# Patient Record
Sex: Female | Born: 1940
Health system: Southern US, Community
[De-identification: ages and names within clinical notes are randomized; demographics above are authoritative.]

## PROBLEM LIST (undated history)

## (undated) DIAGNOSIS — E039 Hypothyroidism, unspecified: Secondary | ICD-10-CM

## (undated) DIAGNOSIS — M1990 Unspecified osteoarthritis, unspecified site: Secondary | ICD-10-CM

## (undated) DIAGNOSIS — M7120 Synovial cyst of popliteal space [Baker], unspecified knee: Secondary | ICD-10-CM

## (undated) HISTORY — DX: Synovial cyst of popliteal space (Baker), unspecified knee: M71.20

## (undated) HISTORY — PX: ABDOMINAL HYSTERECTOMY: SHX81

## (undated) HISTORY — DX: Hypothyroidism, unspecified: E03.9

## (undated) HISTORY — DX: Unspecified osteoarthritis, unspecified site: M19.90

## (undated) HISTORY — PX: TONSILLECTOMY: SUR1361

---

## 2001-01-12 ENCOUNTER — Other Ambulatory Visit: Admission: RE | Admit: 2001-01-12 | Discharge: 2001-01-12 | Payer: Self-pay | Admitting: *Deleted

## 2001-01-12 ENCOUNTER — Ambulatory Visit (HOSPITAL_COMMUNITY): Admission: RE | Admit: 2001-01-12 | Discharge: 2001-01-12 | Payer: Self-pay | Admitting: *Deleted

## 2001-01-12 ENCOUNTER — Encounter: Payer: Self-pay | Admitting: *Deleted

## 2001-10-22 ENCOUNTER — Ambulatory Visit (HOSPITAL_COMMUNITY): Admission: RE | Admit: 2001-10-22 | Discharge: 2001-10-22 | Payer: Self-pay | Admitting: Family Medicine

## 2001-10-22 ENCOUNTER — Encounter: Payer: Self-pay | Admitting: Family Medicine

## 2002-10-29 ENCOUNTER — Encounter: Payer: Self-pay | Admitting: Family Medicine

## 2002-10-29 ENCOUNTER — Ambulatory Visit (HOSPITAL_COMMUNITY): Admission: RE | Admit: 2002-10-29 | Discharge: 2002-10-29 | Payer: Self-pay | Admitting: Family Medicine

## 2003-11-03 ENCOUNTER — Ambulatory Visit (HOSPITAL_COMMUNITY): Admission: RE | Admit: 2003-11-03 | Discharge: 2003-11-03 | Payer: Self-pay | Admitting: Family Medicine

## 2004-04-11 ENCOUNTER — Encounter: Admission: RE | Admit: 2004-04-11 | Discharge: 2004-04-11 | Payer: Self-pay | Admitting: Otolaryngology

## 2004-05-10 ENCOUNTER — Ambulatory Visit (HOSPITAL_COMMUNITY): Admission: RE | Admit: 2004-05-10 | Discharge: 2004-05-10 | Payer: Self-pay | Admitting: Family Medicine

## 2004-05-10 ENCOUNTER — Encounter: Payer: Self-pay | Admitting: Orthopedic Surgery

## 2004-09-05 ENCOUNTER — Ambulatory Visit: Payer: Self-pay | Admitting: Orthopedic Surgery

## 2004-12-24 ENCOUNTER — Ambulatory Visit (HOSPITAL_COMMUNITY): Admission: RE | Admit: 2004-12-24 | Discharge: 2004-12-24 | Payer: Self-pay | Admitting: Family Medicine

## 2005-12-26 ENCOUNTER — Ambulatory Visit (HOSPITAL_COMMUNITY): Admission: RE | Admit: 2005-12-26 | Discharge: 2005-12-26 | Payer: Self-pay | Admitting: Family Medicine

## 2006-06-03 ENCOUNTER — Ambulatory Visit: Payer: Self-pay | Admitting: Internal Medicine

## 2006-06-03 ENCOUNTER — Ambulatory Visit (HOSPITAL_COMMUNITY): Admission: RE | Admit: 2006-06-03 | Discharge: 2006-06-03 | Payer: Self-pay | Admitting: Internal Medicine

## 2006-12-29 ENCOUNTER — Ambulatory Visit (HOSPITAL_COMMUNITY): Admission: RE | Admit: 2006-12-29 | Discharge: 2006-12-29 | Payer: Self-pay | Admitting: Family Medicine

## 2008-01-28 ENCOUNTER — Ambulatory Visit (HOSPITAL_COMMUNITY): Admission: RE | Admit: 2008-01-28 | Discharge: 2008-01-28 | Payer: Self-pay | Admitting: Family Medicine

## 2009-02-08 ENCOUNTER — Ambulatory Visit (HOSPITAL_COMMUNITY): Admission: RE | Admit: 2009-02-08 | Discharge: 2009-02-08 | Payer: Self-pay | Admitting: Family Medicine

## 2009-07-12 ENCOUNTER — Ambulatory Visit (HOSPITAL_COMMUNITY): Admission: RE | Admit: 2009-07-12 | Discharge: 2009-07-12 | Payer: Self-pay | Admitting: Family Medicine

## 2010-02-14 ENCOUNTER — Encounter: Payer: Self-pay | Admitting: Orthopedic Surgery

## 2010-02-20 ENCOUNTER — Ambulatory Visit (HOSPITAL_COMMUNITY): Admission: RE | Admit: 2010-02-20 | Discharge: 2010-02-20 | Payer: Self-pay | Admitting: Family Medicine

## 2010-02-21 ENCOUNTER — Ambulatory Visit: Payer: Self-pay | Admitting: Orthopedic Surgery

## 2010-02-21 ENCOUNTER — Ambulatory Visit (HOSPITAL_COMMUNITY): Admission: RE | Admit: 2010-02-21 | Discharge: 2010-02-21 | Payer: Self-pay | Admitting: Family Medicine

## 2010-02-21 DIAGNOSIS — S52599A Other fractures of lower end of unspecified radius, initial encounter for closed fracture: Secondary | ICD-10-CM | POA: Insufficient documentation

## 2010-03-21 ENCOUNTER — Ambulatory Visit: Payer: Self-pay | Admitting: Orthopedic Surgery

## 2010-04-11 ENCOUNTER — Ambulatory Visit: Payer: Self-pay | Admitting: Orthopedic Surgery

## 2010-05-15 ENCOUNTER — Ambulatory Visit: Payer: Self-pay | Admitting: Orthopedic Surgery

## 2010-05-21 ENCOUNTER — Encounter (HOSPITAL_COMMUNITY)
Admission: RE | Admit: 2010-05-21 | Discharge: 2010-06-20 | Payer: Self-pay | Source: Home / Self Care | Attending: Orthopedic Surgery | Admitting: Orthopedic Surgery

## 2010-05-25 ENCOUNTER — Encounter: Payer: Self-pay | Admitting: Orthopedic Surgery

## 2010-06-11 ENCOUNTER — Encounter: Payer: Self-pay | Admitting: Orthopedic Surgery

## 2010-06-13 ENCOUNTER — Ambulatory Visit: Payer: Self-pay | Admitting: Orthopedic Surgery

## 2010-06-20 ENCOUNTER — Encounter: Payer: Self-pay | Admitting: Orthopedic Surgery

## 2010-07-24 NOTE — Assessment & Plan Note (Signed)
Summary: 3 WK RE-CK/XRAY WRIST/MEDICARE/CAF   Visit Type:  Follow-up Referring Provider:  Dr. Renard Matter  CC:  fx care rt wrist.  History of Present Illness: DOI 02/21/2010  DX: FRACTURE WRIST  TREATMENT SAC   Norco 5 for pain, no pain med needed.  Pain only with sleeping awkward.  Today is 3 week recheck xray OOP.  Doing good, swelling down, only some pain in thumb from cast.  no tenderness at the fracture site  AP lateral and oblique x-rays of the fractured RIGHT wrist  Findings slightly comminuted fracture with normal radial inclination and normal length and normal articular angle  Impression healed fracture  Recommend brace for a month and active range of motion exercises return in one month should not need x-rays just check range of motion  Allergies: 1)  ! Penicillin   Impression & Recommendations:  Problem # 1:  OTHER CLOSED FRACTURES OF DISTAL END OF RADIUS (ONG-295.28) Assessment Improved  Orders: Post-Op Check (41324) Wrist x-ray complete, minimum 3 views (40102)  Patient Instructions: 1)  BRACE X 1 month  2)  remove brace three x a day and bend the wrist 25 times  3)  return 1 month does not need xrays    Orders Added: 1)  Post-Op Check [99024] 2)  Wrist x-ray complete, minimum 3 views [73110]

## 2010-07-24 NOTE — Miscellaneous (Signed)
Summary: clinical Eval for OT  clinical Eval for OT   Imported By: Eugenio Hoes 05/30/2010 16:33:26  _____________________________________________________________________  External Attachment:    Type:   Image     Comment:   External Document

## 2010-07-24 NOTE — Assessment & Plan Note (Signed)
Summary: 1 M RE-CK RT WRIST/NO XRAYS THIS VISIT PER DR H/MEDICARE/CAF   Visit Type:  Follow-up Referring Provider:  Dr. Renard Matter  CC:  right wrist fracture.  History of Present Illness: I saw Carrie Osborne in the office today for a 1 month followup visit.  She is a 70 years old woman with the complaint of:  right wrist  DOI 02/21/2010  DX: FRACTURE WRIST  TREATMENT brace  Norco 5 for pain, no pain med needed.  She had been having pain and swelling in the thumb area. She has been doing her exercises and it is getting better.  What we see is that her range of motion has improved. She has a little tenosynovitis of the flexor tendon of the thumb. She cannot quite fully make a fist. She has no tenderness at the fracture site. The hand is slightly swollen. The metacarpophalangeal joints seem to be a little bit stiff with approximately 80 of flexion.  Recommend occupational therapy. Return in one month  Allergies: 1)  ! Penicillin   Impression & Recommendations:  Problem # 1:  OTHER CLOSED FRACTURES OF DISTAL END OF RADIUS (MWN-027.25) Assessment Improved  Orders: Occupational Therapy (OT) Post-Op Check (36644)  Medications Added to Medication List This Visit: 1)  Neurontin 100 Mg Caps (Gabapentin) .Marland Kitchen.. 1 by mouth three times a day may increase up to 300 mg three times a day as needed for right leg pain  Patient Instructions: 1)  OT 2 x week for 4 weeks 2)  Come back in a month Prescriptions: NEURONTIN 100 MG CAPS (GABAPENTIN) 1 by mouth three times a day may increase up to 300 mg three times a day as needed for right leg pain  #90 x 5   Entered and Authorized by:   Fuller Canada MD   Signed by:   Fuller Canada MD on 05/15/2010   Method used:   Print then Give to Patient   RxID:   [id number]    Orders Added: 1)  Occupational Therapy [OT] 2)  Post-Op Check [33295]

## 2010-07-24 NOTE — Progress Notes (Signed)
Summary: Initial Eval  Initial Eval   Imported By: Cammie Sickle 02/21/2010 16:20:10  _____________________________________________________________________  External Attachment:    Type:   Image     Comment:   External Document

## 2010-07-24 NOTE — Assessment & Plan Note (Signed)
Summary: EVAL/TREAT FX WRIST/XR [facility] 02/21/10/REF [facility]/MEDICARE,BC/CAF   Visit Type:  new patient Referring Novalie Leamy:  Dr. Renard Matter  CC:  right wrist fracture.Marland Kitchen  History of Present Illness: 70 year old female fell overstepping stone on August 30.  Went to the emergency room.  Comes in for followup complaining of constant severe sharp dull pain over the RIGHT wrist.  She notes some bruising and swelling.  Any motion of the wrist causes increased pain while keeping it still relieves it somewhat.  She's not taking anything for pain.  DOI 02-20-10.  Xrays taken at [facility] on 02-21-10 shows nondisplaced fracture of the distal radius with extension into the distal radial ulnar and radiocarpal joints. report and x-ray review debris with interpretation nondisplaced radius fracture distal   Allergies (verified): 1)  ! Penicillin  Past History:  Past Medical History: Low Thyroid High Cholesterol  Past Surgical History: T A H and B S O Cyst in breast  Family History: negative family history  Social History: social history married, retired, highest grade completed rate 12.  No smoking or alcohol.  Review of Systems Constitutional:  Denies weight loss, weight gain, fever, chills, and fatigue. Cardiovascular:  Denies chest pain, palpitations, fainting, and murmurs. Respiratory:  Denies short of breath, wheezing, couch, tightness, pain on inspiration, and snoring . Gastrointestinal:  Denies heartburn, nausea, vomiting, diarrhea, constipation, and blood in your stools. Genitourinary:  Denies frequency, urgency, difficulty urinating, painful urination, flank pain, and bleeding in urine. Neurologic:  Denies numbness, tingling, unsteady gait, dizziness, tremors, and seizure. Musculoskeletal:  Denies joint pain, swelling, instability, stiffness, redness, heat, and muscle pain. Endocrine:  Denies excessive thirst, exessive urination, and heat or cold intolerance. Psychiatric:  Denies  nervousness, depression, anxiety, and hallucinations. Skin:  Denies changes in the skin, poor healing, rash, itching, and redness. HEENT:  Denies blurred or double vision, eye pain, redness, and watering. Immunology:  Complains of seasonal allergies; denies sinus problems and allergic to bee stings. Hemoatologic:  Denies easy bleeding and brusing.  Physical Exam  Additional Exam:  GEN: well developed, well nourished, normal grooming and hygiene, no deformity and normal body habitus.   CDV: pulses are normal, no edema, no erythema. no tenderness  Lymph: normal lymph nodes   Skin: no rashes, skin lesions or open sores   NEURO: normal coordination, reflexes, sensation.   Psyche: awake, alert and oriented. Mood normal   Gait: Normal gait pattern  Evaluation of the RIGHT wrist reveals swelling and tenderness over the distal radius, range of motion limited to 20.  Motor exam muscle tone normal.  Joint stability not tested secondary to pain.  Elbow and shoulder have no tenderness normal range of motion normal strength and no instability.     Impression & Recommendations:  Problem # 1:  OTHER CLOSED FRACTURES OF DISTAL END OF RADIUS (ZOX-096.04) Assessment New a short arm cast applied   Orders: New Patient Level III (54098) Distal Radius Fx (25600)  Medications Added to Medication List This Visit: 1)  Norco 5-325 Mg Tabs (Hydrocodone-acetaminophen) .Marland Kitchen.. 1 by mouth q 6 as needed pain  Patient Instructions: 1)  4 weeks  x-rays OOP Prescriptions: NORCO 5-325 MG TABS (HYDROCODONE-ACETAMINOPHEN) 1 by mouth q 6 as needed pain  #30 x 1   Entered and Authorized by:   Fuller Canada MD   Signed by:   Fuller Canada MD on 02/21/2010   Method used:   Print then Give to Patient   RxID:   [id number]

## 2010-07-24 NOTE — Assessment & Plan Note (Signed)
Summary: 4 WK RE-CK/XRAY WRIST/FX CARE/MEDICARE,CAF   Visit Type:  Follow-up Referring Provider:  Dr. Renard Matter  CC:  fx care wrist.  History of Present Illness:  Today is 4 week recheck, xrays OOP rt wrist.  Norco 5 for pain, no pain med needed.  Pain only with sleeping awkward.  we did take new x-rays today, and it shows fractures and stable alignment, but has not healed.  repeat casting for 3 weeks.      Allergies: 1)  ! Penicillin   Impression & Recommendations:  Problem # 1:  OTHER CLOSED FRACTURES OF DISTAL END OF RADIUS (ICD-813.42)  3 views, RIGHT wrist. There is a nondisplaced distal radius fracture, which has not completely healed, but has not lost position.  Impression healing RIGHT distal radius fracture. Plan short arm cast. Additional 3 weeks and x-ray out of plaster  Orders: Post-Op Check (95621) Wrist x-ray complete, minimum 3 views (30865)  Patient Instructions: 1)  XRAYS OOP IN 3 WEEKS

## 2010-07-24 NOTE — Letter (Signed)
Summary: History form  History form   Imported By: Jacklynn Ganong 03/02/2010 09:37:44  _____________________________________________________________________  External Attachment:    Type:   Image     Comment:   External Document

## 2010-07-26 NOTE — Miscellaneous (Signed)
Summary: OT PROGRESS NOTES   OT PROGRESS NOTES   Imported By: Jacklynn Ganong 06/12/2010 16:00:06  _____________________________________________________________________  External Attachment:    Type:   Image     Comment:   External Document

## 2010-07-26 NOTE — Miscellaneous (Signed)
Summary: Progress note OT   Progress note OT   Imported By: Eugenio Hoes 06/28/2010 14:41:37  _____________________________________________________________________  External Attachment:    Type:   Image     Comment:   External Document

## 2010-07-26 NOTE — Assessment & Plan Note (Signed)
Summary: 1 M RE-CK RT WRIST FOL'GPT/OT/MEDICARE/BCBS/CAF   Visit Type:  Follow-up Referring Provider:  Dr. Renard Matter  CC:  a fracture and RIGHT wrist.  History of Present Illness:   A 70 year old female had a RIGHT wrist fracture treated with a cast followed by occupational therapy.  She is doing well with improved range of motion and strength and functional abilities.  She is full pronation, supination, near full flexion-extension of the wrist.  Doing well. Follow up as needed after finishing physical therapy, occupational therapy  Allergies: 1)  ! Penicillin   Impression & Recommendations:  Problem # 1:  OTHER CLOSED FRACTURES OF DISTAL END OF RADIUS (ZOX-096.04) Assessment Improved  Orders: Est. Patient Level II (54098)  Patient Instructions: 1)  Finish PT /OT  2)  return as needed    Orders Added: 1)  Est. Patient Level II [11914]

## 2010-11-09 NOTE — Op Note (Signed)
[name], Carrie Osborne             ACCOUNT NO.:  [id number]   MEDICAL RECORD NO.:  [id number]          PATIENT TYPE:  AMB   LOCATION:  DAY                           FACILITY:  [facility]   PHYSICIAN:  R. Roetta Sessions, M.D. DATE OF BIRTH:  22-May-1941   DATE OF PROCEDURE:  06/03/2006  DATE OF DISCHARGE:                               OPERATIVE REPORT   PROCEDURE PERFORMED:  Screening colonoscopy.   INDICATIONS FOR PROCEDURE:  The patient is a 70 year old Caucasian  female devoid of any lower GI tract symptoms sent over at the courtesy  of Dr. Butch Penny for colorectal cancer screening.  There is no  family history of colorectal neoplasia in any first degree relatives.  She has no symptoms, as stated above, and she has never had her lower GI  tract imaged.  Colonoscopy is now being done as a screening maneuver.  This approach has been discussed with the patient at length.  The  potential risks, benefits, and alternatives have been reviewed,  questions answered, she is agreeable.  Please see documentation in the  medical record.   DESCRIPTION OF PROCEDURE:  O2 saturation, blood pressure, and pulse rate  were monitored throughout the entire procedure.  Conscious sedation with  Versed 5 mg IV and Demerol 75 mg IV in divided doses.   INSTRUMENT USED:  Pentax video chip system.   FINDINGS:  Digital rectal examination revealed no abnormalities.  Endoscopic findings:  The prep was good.  Colon:  The colonic mucosa was surveyed from the rectosigmoid junction  through the left, transverse, right colon, to the area of the  appendiceal orifice and ileocecal valve and cecum.  These structures  were well seen and photographed for the record.  From this level, the  scope was slowly and cautiously withdrawn.  All previously mentioned  mucosal surfaces were again seen.  The patient had scattered sigmoid  diverticulum.  The remainder of the colonic mucosa appeared normal.  The  scope was pulled down  to the rectum.  A thorough examination of the  rectal mucosa including a retroflex view of the anal verge was  undertaken.  The rectal mucosa appeared normal.  The patient tolerated  the procedure well and was reacted in endoscopy.   IMPRESSION:  1. Normal rectum.  2. Left sided diverticulum.  3. The remainder of the colonic mucosa appeared normal.   RECOMMENDATIONS:  1. Diverticulosis literature provided to [name].  2. Repeat screening colonoscopy in ten years.      Jonathon Bellows, M.D.  Electronically Signed     RMR/MEDQ  D:  06/03/2006  T:  06/03/2006  Job:  [id number]   cc:   Angus G. Renard Matter, MD  Fax: [phone]

## 2011-01-31 ENCOUNTER — Other Ambulatory Visit (HOSPITAL_COMMUNITY): Payer: Self-pay | Admitting: Family Medicine

## 2011-01-31 DIAGNOSIS — Z139 Encounter for screening, unspecified: Secondary | ICD-10-CM

## 2011-02-26 ENCOUNTER — Ambulatory Visit (HOSPITAL_COMMUNITY)
Admission: RE | Admit: 2011-02-26 | Discharge: 2011-02-26 | Disposition: A | Discharge status: Home / Self Care | Payer: Medicare Other | Source: Ambulatory Visit | Attending: Family Medicine | Admitting: Family Medicine

## 2011-02-26 DIAGNOSIS — Z139 Encounter for screening, unspecified: Secondary | ICD-10-CM

## 2011-02-26 DIAGNOSIS — Z1231 Encounter for screening mammogram for malignant neoplasm of breast: Secondary | ICD-10-CM | POA: Insufficient documentation

## 2012-03-12 ENCOUNTER — Other Ambulatory Visit (HOSPITAL_COMMUNITY): Payer: Self-pay | Admitting: Family Medicine

## 2012-03-12 DIAGNOSIS — Z139 Encounter for screening, unspecified: Secondary | ICD-10-CM

## 2012-03-19 ENCOUNTER — Ambulatory Visit (HOSPITAL_COMMUNITY)
Admission: RE | Admit: 2012-03-19 | Discharge: 2012-03-19 | Disposition: A | Discharge status: Home / Self Care | Payer: Medicare Other | Source: Ambulatory Visit | Attending: Family Medicine | Admitting: Family Medicine

## 2012-03-19 DIAGNOSIS — Z1231 Encounter for screening mammogram for malignant neoplasm of breast: Secondary | ICD-10-CM | POA: Insufficient documentation

## 2012-03-19 DIAGNOSIS — Z139 Encounter for screening, unspecified: Secondary | ICD-10-CM

## 2012-04-23 ENCOUNTER — Other Ambulatory Visit: Payer: Self-pay | Admitting: Family Medicine

## 2012-04-23 DIAGNOSIS — M545 Low back pain: Secondary | ICD-10-CM

## 2012-04-27 ENCOUNTER — Ambulatory Visit (HOSPITAL_COMMUNITY)
Admission: RE | Admit: 2012-04-27 | Discharge: 2012-04-27 | Disposition: A | Discharge status: Home / Self Care | Payer: Medicare Other | Source: Ambulatory Visit | Attending: Family Medicine | Admitting: Family Medicine

## 2012-04-27 DIAGNOSIS — M545 Low back pain, unspecified: Secondary | ICD-10-CM | POA: Insufficient documentation

## 2012-04-27 DIAGNOSIS — M79609 Pain in unspecified limb: Secondary | ICD-10-CM | POA: Insufficient documentation

## 2012-04-27 DIAGNOSIS — M5126 Other intervertebral disc displacement, lumbar region: Secondary | ICD-10-CM | POA: Insufficient documentation

## 2012-04-29 ENCOUNTER — Other Ambulatory Visit: Payer: Self-pay | Admitting: Family Medicine

## 2012-04-29 DIAGNOSIS — M545 Low back pain: Secondary | ICD-10-CM

## 2012-05-05 ENCOUNTER — Other Ambulatory Visit: Payer: Medicare Other

## 2012-05-05 ENCOUNTER — Ambulatory Visit
Admission: RE | Admit: 2012-05-05 | Discharge: 2012-05-05 | Disposition: A | Discharge status: Home / Self Care | Payer: Medicare Other | Source: Ambulatory Visit | Attending: Family Medicine | Admitting: Family Medicine

## 2012-05-05 DIAGNOSIS — M545 Low back pain: Secondary | ICD-10-CM

## 2012-05-05 MED ORDER — IOHEXOL 180 MG/ML  SOLN
1.0000 mL | Freq: Once | INTRAMUSCULAR | Status: AC | PRN
  Administered 2012-05-05: 1 mL via INTRAVENOUS

## 2012-05-05 MED ORDER — METHYLPREDNISOLONE ACETATE 40 MG/ML INJ SUSP (RADIOLOG
120.0000 mg | Freq: Once | INTRAMUSCULAR | Status: AC
  Administered 2012-05-05: 120 mg via EPIDURAL

## 2012-05-13 ENCOUNTER — Other Ambulatory Visit (HOSPITAL_COMMUNITY): Payer: Self-pay | Admitting: Family Medicine

## 2012-05-13 DIAGNOSIS — M545 Low back pain: Secondary | ICD-10-CM

## 2012-05-18 ENCOUNTER — Other Ambulatory Visit: Payer: Self-pay | Admitting: Family Medicine

## 2012-05-18 DIAGNOSIS — M5030 Other cervical disc degeneration, unspecified cervical region: Secondary | ICD-10-CM

## 2012-05-26 ENCOUNTER — Other Ambulatory Visit: Payer: Self-pay | Admitting: Family Medicine

## 2012-05-26 ENCOUNTER — Ambulatory Visit
Admission: RE | Admit: 2012-05-26 | Discharge: 2012-05-26 | Disposition: A | Discharge status: Home / Self Care | Payer: Medicare Other | Source: Ambulatory Visit | Attending: Family Medicine | Admitting: Family Medicine

## 2012-05-26 DIAGNOSIS — M5030 Other cervical disc degeneration, unspecified cervical region: Secondary | ICD-10-CM

## 2012-05-26 MED ORDER — METHYLPREDNISOLONE ACETATE 40 MG/ML INJ SUSP (RADIOLOG
120.0000 mg | Freq: Once | INTRAMUSCULAR | Status: AC
  Administered 2012-05-26: 120 mg via EPIDURAL

## 2012-05-26 MED ORDER — IOHEXOL 180 MG/ML  SOLN
1.0000 mL | Freq: Once | INTRAMUSCULAR | Status: AC | PRN
  Administered 2012-05-26: 1 mL via EPIDURAL

## 2012-06-03 ENCOUNTER — Other Ambulatory Visit: Payer: Self-pay | Admitting: Family Medicine

## 2012-06-03 DIAGNOSIS — M549 Dorsalgia, unspecified: Secondary | ICD-10-CM

## 2012-06-11 ENCOUNTER — Other Ambulatory Visit: Payer: Medicare Other

## 2012-09-28 ENCOUNTER — Other Ambulatory Visit (HOSPITAL_COMMUNITY): Payer: Self-pay | Admitting: Family Medicine

## 2012-09-28 DIAGNOSIS — R52 Pain, unspecified: Secondary | ICD-10-CM

## 2012-09-29 ENCOUNTER — Ambulatory Visit (HOSPITAL_COMMUNITY)
Admission: RE | Admit: 2012-09-29 | Discharge: 2012-09-29 | Disposition: A | Discharge status: Home / Self Care | Payer: Medicare Other | Source: Ambulatory Visit | Attending: Family Medicine | Admitting: Family Medicine

## 2012-09-29 ENCOUNTER — Other Ambulatory Visit (HOSPITAL_COMMUNITY): Payer: Self-pay | Admitting: Family Medicine

## 2012-09-29 DIAGNOSIS — R52 Pain, unspecified: Secondary | ICD-10-CM

## 2012-09-29 DIAGNOSIS — M7989 Other specified soft tissue disorders: Secondary | ICD-10-CM | POA: Insufficient documentation

## 2012-09-29 DIAGNOSIS — M79609 Pain in unspecified limb: Secondary | ICD-10-CM | POA: Insufficient documentation

## 2012-10-20 ENCOUNTER — Encounter: Payer: Self-pay | Admitting: Orthopedic Surgery

## 2012-10-20 ENCOUNTER — Ambulatory Visit (INDEPENDENT_AMBULATORY_CARE_PROVIDER_SITE_OTHER): Payer: Medicare Other | Admitting: Orthopedic Surgery

## 2012-10-20 VITALS — BP 136/70 | Ht 61.0 in | Wt 183.0 lb

## 2012-10-20 DIAGNOSIS — M1712 Unilateral primary osteoarthritis, left knee: Secondary | ICD-10-CM | POA: Insufficient documentation

## 2012-10-20 DIAGNOSIS — M1710 Unilateral primary osteoarthritis, unspecified knee: Secondary | ICD-10-CM

## 2012-10-20 DIAGNOSIS — M23329 Other meniscus derangements, posterior horn of medial meniscus, unspecified knee: Secondary | ICD-10-CM

## 2012-10-20 DIAGNOSIS — M23322 Other meniscus derangements, posterior horn of medial meniscus, left knee: Secondary | ICD-10-CM

## 2012-10-20 DIAGNOSIS — M7120 Synovial cyst of popliteal space [Baker], unspecified knee: Secondary | ICD-10-CM | POA: Insufficient documentation

## 2012-10-20 DIAGNOSIS — M7122 Synovial cyst of popliteal space [Baker], left knee: Secondary | ICD-10-CM

## 2012-10-20 NOTE — Patient Instructions (Addendum)
Knee Injection  Joint injections are shots. Your caregiver will place a needle into your knee joint. The needle is used to put medicine into the joint. These shots can be used to help treat different painful knee conditions such as osteoarthritis, bursitis, local flare-ups of rheumatoid arthritis, and pseudogout. Anti-inflammatory medicines such as corticosteroids and anesthetics are the most common medicines used for joint and soft tissue injections.   PROCEDURE  · The skin over the kneecap will be cleaned with an antiseptic solution.  · Your caregiver will inject a small amount of a local anesthetic (a medicine like Novocaine) just under the skin in the area that was cleaned.  · After the area becomes numb, a second injection is done. This second injection usually includes an anesthetic and an anti-inflammatory medicine called a steroid or cortisone. The needle is carefully placed in between the kneecap and the knee, and the medicine is injected into the joint space.  · After the injection is done, the needle is removed. Your caregiver may place a bandage over the injection site. The whole procedure takes no more than a couple of minutes.  BEFORE THE PROCEDURE   Wash all of the skin around the entire knee area. Try to remove any loose, scaling skin. There is no other specific preparation necessary unless advised otherwise by your caregiver.  LET YOUR CAREGIVER KNOW ABOUT:   · Allergies.  · Medications taken including herbs, eye drops, over the counter medications, and creams.  · Use of steroids (by mouth or creams).  · Possible pregnancy, if applicable.  · Previous problems with anesthetics or Novocaine.  · History of blood clots (thrombophlebitis).  · History of bleeding or blood problems.  · Previous surgery.  · Other health problems.  RISKS AND COMPLICATIONS  Side effects from cortisone shots are rare. They include:   · Slight bruising of the skin.  · Shrinkage of the normal fatty tissue under the skin where  the shot was given.  · Increase in pain after the shot.  · Infection.  · Weakening of tendons or tendon rupture.  · Allergic reaction to the medicine.  · Diabetics may have a temporary increase in their blood sugar after a shot.  · Cortisone can temporarily weaken the immune system. While receiving these shots, you should not get certain vaccines. Also, avoid contact with anyone who has chickenpox or measles. Especially if you have never had these diseases or have not been previously immunized. Your immune system may not be strong enough to fight off the infection while the cortisone is in your system.  AFTER THE PROCEDURE   · You can go home after the procedure.  · You may need to put ice on the joint 15 to 20 minutes every 3 or 4 hours until the pain goes away.  · You may need to put an elastic bandage on the joint.  HOME CARE INSTRUCTIONS   · Only take over-the-counter or prescription medicines for pain, discomfort, or fever as directed by your caregiver.  · You should avoid stressing the joint. Unless advised otherwise, avoid activities that put a lot of pressure on a knee joint, such as:  · Jogging.  · Bicycling.  · Recreational climbing.  · Hiking.  · Laying down and elevating the leg/knee above the level of your heart can help to minimize swelling.  SEEK MEDICAL CARE IF:   · You have repeated or worsening swelling.  · There is drainage from the puncture area.  ·   You develop red streaking that extends above or below the site where the needle was inserted.  SEEK IMMEDIATE MEDICAL CARE IF:   · You develop a fever.  · You have pain that gets worse even though you are taking pain medicine.  · The area is red and warm, and you have trouble moving the joint.  MAKE SURE YOU:   · Understand these instructions.  · Will watch your condition.  · Will get help right away if you are not doing well or get worse.  Document Released: 09/01/2006 Document Revised: 09/02/2011 Document Reviewed: 05/29/2007  [employer]® Patient  Information ©2013 [employer].

## 2012-10-20 NOTE — Progress Notes (Signed)
Patient ID: Carrie Osborne, female   DOB: May 24, 1941, 72 y.o.   MRN: [id number] Chief Complaint  Patient presents with  . Knee Pain    Bakers cyst in left knee    HISTORY:  72 year old female with previous history Baker's cyst presents with left knee Baker cyst starting with pain and swelling April 3. She went to see a local orthopedist when I was out of town he gave her an injection to work for about 2 weeks she complains of throbbing burning 9/10 constant pain mostly mid afternoon and early evening, it is relieved by ice pack and rest, is worse when she tries to enter knee she has noticed some joint, she has not had any locking or catching. Review of systems seasonal allergies and joint swelling of the knee no chest pain or shortness of breath no numbness or tingling  She reports penicillin allergy  Medical problems of high cholesterol and low thyroid  Previous surgery hysterectomy  She is married retired does not smoke or drink  Medications Synthroid, loratadine, calcium, niacin, simvastatin, coenzyme Q.  BP 136/70  Ht 5\' 1"  (1.549 m)  Wt 183 lb (83.008 kg)  BMI 34.6 kg/m2 General appearance is normal, the patient is alert and oriented x3 with normal mood and affect. She does not appear to limp or have an abnormal gait. She is tender over the medial joint line there is no joint effusion the bursa is nontender she is tender slightly over the lateral joint line, knee is stable, muscle tone normal, skin intact, pulses good mild edema distally normal sensation  I think she either has a torn medial meniscus degenerative, degenerative arthritis, remote possibility of loose body or a combination of.  Recommend injection followup 3 weeks if no improvement consider MRI  Knee  Injection Procedure Note  Pre-operative Diagnosis: left knee oa  Post-operative Diagnosis: same  Indications: pain  Anesthesia: ethyl chloride   Procedure Details   Verbal consent was obtained for the  procedure. Time out was completed.The joint was prepped with alcohol, followed by  Ethyl chloride spray and A 20 gauge needle was inserted into the knee via lateral approach; 4ml 1% lidocaine and 1 ml of depomedrol  was then injected into the joint . The needle was removed and the area cleansed and dressed.  Complications:  None; patient tolerated the procedure well.  Baker's cyst of knee, left  Osteoarthritis of left knee  Medial meniscus, posterior horn derangement, left

## 2012-10-26 ENCOUNTER — Ambulatory Visit: Payer: Medicare Other | Admitting: Orthopedic Surgery

## 2012-10-27 ENCOUNTER — Telehealth: Payer: Self-pay | Admitting: Orthopedic Surgery

## 2012-10-27 NOTE — Telephone Encounter (Signed)
Carrie Osborne said her pain is worse, going down her calf.  She is not scheduled to see you until 11/10/12, and is asking for an earlier appointment.  She is worried that the cyst needs to be drained.  Said the pain is so bad it is waking her up at night.  Please advise.  Her #  h - [id number] or c [phone]

## 2012-10-28 ENCOUNTER — Other Ambulatory Visit: Payer: Self-pay | Admitting: Orthopedic Surgery

## 2012-10-28 MED ORDER — PREDNISONE 10 MG PO KIT: 10.0000 mg | PACK | ORAL | Status: DC

## 2012-10-28 NOTE — Telephone Encounter (Signed)
Tammy spoke with the patient

## 2012-10-28 NOTE — Telephone Encounter (Signed)
#  1 we would not drain the cyst   #2 more likely to be related to her back problem   #3 order steroid dose pack (done)   # 4 order repeat lumbar esi series (Tammy place this order)  (Tammy and I will put orders in)

## 2012-10-28 NOTE — Telephone Encounter (Signed)
Relayed information to patient

## 2012-10-28 NOTE — Telephone Encounter (Signed)
Carrie Osborne called back this morning, asking to be seen today,said her knee is swollen very badly and the pain is gong up to her buttocks. Told her you are only here a short while this morning and then have to be at the hospital,

## 2012-10-29 ENCOUNTER — Ambulatory Visit (INDEPENDENT_AMBULATORY_CARE_PROVIDER_SITE_OTHER): Payer: Medicare Other | Admitting: Orthopedic Surgery

## 2012-10-29 ENCOUNTER — Encounter: Payer: Self-pay | Admitting: Orthopedic Surgery

## 2012-10-29 VITALS — BP 130/82 | Ht 61.0 in | Wt 183.0 lb

## 2012-10-29 DIAGNOSIS — M7630 Iliotibial band syndrome, unspecified leg: Secondary | ICD-10-CM

## 2012-10-29 DIAGNOSIS — M7120 Synovial cyst of popliteal space [Baker], unspecified knee: Secondary | ICD-10-CM

## 2012-10-29 DIAGNOSIS — M6580 Other synovitis and tenosynovitis, unspecified site: Secondary | ICD-10-CM

## 2012-10-29 NOTE — Progress Notes (Signed)
Patient ID: Carrie Osborne, female   DOB: 27-Mar-1941, 72 y.o.   MRN: [id number] Chief Complaint  Patient presents with  . Follow-up    3 week recheck left knee bakers cyst    History the patient was scheduled for 3 week followup related to a Baker's cyst and leg pain which is primarily behind the knee associated with some lateral pain and burning on the side of the knee  She comes in earlier because of severe pain. We asked her to take a Dosepak she wants to talk over with me and be reexamined first  The knee does not catch lock or give way  Most of the pain is behind the knee and just some burning on the side of the knee  BP 130/82  Ht 5\' 1"  (1.549 m)  Wt 183 lb (83.008 kg)  BMI 34.6 kg/m2  General appearance is normal, the patient is alert and oriented x3 with normal mood and affect.  She walks fine she has normal flexion extension of the knee with no pain she has some mild patellofemoral crepitance the medial and lateral joint lines are nontender she has tenderness in the popliteal fossa and over the iliotibial band skin is normal no joint effusion distal pulses are intact and sensation is normal distally  I injected her iliotibial band and asked her to go ahead and take the Dosepak he has a history of lumbar disease status post 2 epidural steroid injections which improved her back pain  Differential diagnosis peripheral neuropathy Peripheral radiculopathy Meniscal tear Tendinitis  Procedure Inject iliotibial band Verbal consent Timeout confirmed left knee as procedure site Under sterile conditions the lateral iliotibial band distal insertion was injected with 40 mg of Depo-Medrol and 1 cc 1% lidocaine this was tolerated well without complications

## 2012-10-29 NOTE — Patient Instructions (Addendum)
Pick up dose pack at pharmacy

## 2012-11-10 ENCOUNTER — Ambulatory Visit: Payer: Medicare Other | Admitting: Orthopedic Surgery

## 2012-11-12 ENCOUNTER — Ambulatory Visit (INDEPENDENT_AMBULATORY_CARE_PROVIDER_SITE_OTHER): Payer: Medicare Other | Admitting: Orthopedic Surgery

## 2012-11-12 ENCOUNTER — Encounter: Payer: Self-pay | Admitting: Orthopedic Surgery

## 2012-11-12 VITALS — BP 124/76 | Ht 61.0 in | Wt 183.0 lb

## 2012-11-12 DIAGNOSIS — M7630 Iliotibial band syndrome, unspecified leg: Secondary | ICD-10-CM

## 2012-11-12 DIAGNOSIS — IMO0002 Reserved for concepts with insufficient information to code with codable children: Secondary | ICD-10-CM

## 2012-11-12 DIAGNOSIS — M6580 Other synovitis and tenosynovitis, unspecified site: Secondary | ICD-10-CM

## 2012-11-12 DIAGNOSIS — M792 Neuralgia and neuritis, unspecified: Secondary | ICD-10-CM

## 2012-11-12 MED ORDER — PREDNISONE 10 MG PO KIT: 10.0000 mg | PACK | ORAL | Status: DC

## 2012-11-12 NOTE — Progress Notes (Signed)
Patient ID: Carrie Osborne, female   DOB: Mar 21, 1941, 72 y.o.   MRN: [id number] Chief Complaint  Patient presents with  . Follow-up    2 week recheck left knee after dose pack    BP 124/76  Ht 5\' 1"  (1.549 m)  Wt 183 lb (83.008 kg)  BMI 34.6 kg/m2  The patient took the dose back in noted relief. She still has some residual numbness on the lateral knee no giving way symptoms  Review of system denies back vitals signs as recorded amylase is normal and he is stable full range of motion she has some tenderness over the iliotibial band distally. Motor exam is normal skin is intact decreased sensation along the lateral knee. She joint x3 mood and affect are normal appearance is normal  Impression neuralgia of some etiology. Recommend continue dose pack follow up as needed

## 2012-11-12 NOTE — Patient Instructions (Addendum)
Resume dose pack

## 2012-12-21 ENCOUNTER — Ambulatory Visit (HOSPITAL_COMMUNITY)
Admission: RE | Admit: 2012-12-21 | Discharge: 2012-12-21 | Disposition: A | Discharge status: Home / Self Care | Payer: Medicare Other | Source: Ambulatory Visit | Attending: Family Medicine | Admitting: Family Medicine

## 2012-12-21 ENCOUNTER — Other Ambulatory Visit (HOSPITAL_COMMUNITY): Payer: Self-pay | Admitting: Family Medicine

## 2012-12-21 DIAGNOSIS — R52 Pain, unspecified: Secondary | ICD-10-CM

## 2012-12-21 DIAGNOSIS — R1032 Left lower quadrant pain: Secondary | ICD-10-CM

## 2012-12-21 DIAGNOSIS — K5730 Diverticulosis of large intestine without perforation or abscess without bleeding: Secondary | ICD-10-CM | POA: Insufficient documentation

## 2012-12-21 DIAGNOSIS — K5732 Diverticulitis of large intestine without perforation or abscess without bleeding: Secondary | ICD-10-CM | POA: Insufficient documentation

## 2012-12-21 MED ORDER — IOHEXOL 300 MG/ML  SOLN
100.0000 mL | Freq: Once | INTRAMUSCULAR | Status: AC | PRN
  Administered 2012-12-21: 100 mL via INTRAVENOUS

## 2013-03-15 ENCOUNTER — Other Ambulatory Visit (HOSPITAL_COMMUNITY): Payer: Self-pay | Admitting: Family Medicine

## 2013-03-15 DIAGNOSIS — Z139 Encounter for screening, unspecified: Secondary | ICD-10-CM

## 2013-03-23 ENCOUNTER — Ambulatory Visit (HOSPITAL_COMMUNITY)
Admission: RE | Admit: 2013-03-23 | Discharge: 2013-03-23 | Disposition: A | Discharge status: Home / Self Care | Payer: Medicare Other | Source: Ambulatory Visit | Attending: Family Medicine | Admitting: Family Medicine

## 2013-03-23 DIAGNOSIS — Z1231 Encounter for screening mammogram for malignant neoplasm of breast: Secondary | ICD-10-CM | POA: Insufficient documentation

## 2013-03-23 DIAGNOSIS — Z139 Encounter for screening, unspecified: Secondary | ICD-10-CM

## 2013-07-12 DIAGNOSIS — L570 Actinic keratosis: Secondary | ICD-10-CM | POA: Diagnosis not present

## 2013-07-12 DIAGNOSIS — C44319 Basal cell carcinoma of skin of other parts of face: Secondary | ICD-10-CM | POA: Diagnosis not present

## 2013-09-23 DIAGNOSIS — Z0000 Encounter for general adult medical examination without abnormal findings: Secondary | ICD-10-CM | POA: Diagnosis not present

## 2013-09-23 DIAGNOSIS — E785 Hyperlipidemia, unspecified: Secondary | ICD-10-CM | POA: Diagnosis not present

## 2013-09-23 DIAGNOSIS — Z79899 Other long term (current) drug therapy: Secondary | ICD-10-CM | POA: Diagnosis not present

## 2013-09-23 DIAGNOSIS — E039 Hypothyroidism, unspecified: Secondary | ICD-10-CM | POA: Diagnosis not present

## 2013-10-05 DIAGNOSIS — H7200 Central perforation of tympanic membrane, unspecified ear: Secondary | ICD-10-CM | POA: Diagnosis not present

## 2013-10-05 DIAGNOSIS — H6980 Other specified disorders of Eustachian tube, unspecified ear: Secondary | ICD-10-CM | POA: Diagnosis not present

## 2013-10-05 DIAGNOSIS — J301 Allergic rhinitis due to pollen: Secondary | ICD-10-CM | POA: Diagnosis not present

## 2013-10-05 DIAGNOSIS — H6120 Impacted cerumen, unspecified ear: Secondary | ICD-10-CM | POA: Diagnosis not present

## 2013-11-11 DIAGNOSIS — L910 Hypertrophic scar: Secondary | ICD-10-CM | POA: Diagnosis not present

## 2013-11-11 DIAGNOSIS — D485 Neoplasm of uncertain behavior of skin: Secondary | ICD-10-CM | POA: Diagnosis not present

## 2013-11-11 DIAGNOSIS — D235 Other benign neoplasm of skin of trunk: Secondary | ICD-10-CM | POA: Diagnosis not present

## 2014-01-06 DIAGNOSIS — L905 Scar conditions and fibrosis of skin: Secondary | ICD-10-CM | POA: Diagnosis not present

## 2014-02-07 DIAGNOSIS — L905 Scar conditions and fibrosis of skin: Secondary | ICD-10-CM | POA: Diagnosis not present

## 2014-02-07 DIAGNOSIS — L570 Actinic keratosis: Secondary | ICD-10-CM | POA: Diagnosis not present

## 2014-03-21 ENCOUNTER — Other Ambulatory Visit (HOSPITAL_COMMUNITY): Payer: Self-pay | Admitting: Family Medicine

## 2014-03-21 DIAGNOSIS — Z139 Encounter for screening, unspecified: Secondary | ICD-10-CM

## 2014-03-21 DIAGNOSIS — Z1231 Encounter for screening mammogram for malignant neoplasm of breast: Secondary | ICD-10-CM

## 2014-03-24 ENCOUNTER — Ambulatory Visit (HOSPITAL_COMMUNITY)
Admission: RE | Admit: 2014-03-24 | Discharge: 2014-03-24 | Disposition: A | Discharge status: Home / Self Care | Payer: Medicare Other | Source: Ambulatory Visit | Attending: Family Medicine | Admitting: Family Medicine

## 2014-03-24 DIAGNOSIS — Z1231 Encounter for screening mammogram for malignant neoplasm of breast: Secondary | ICD-10-CM | POA: Insufficient documentation

## 2014-03-30 DIAGNOSIS — Z23 Encounter for immunization: Secondary | ICD-10-CM | POA: Diagnosis not present

## 2014-04-06 DIAGNOSIS — E039 Hypothyroidism, unspecified: Secondary | ICD-10-CM | POA: Diagnosis not present

## 2014-04-06 DIAGNOSIS — E785 Hyperlipidemia, unspecified: Secondary | ICD-10-CM | POA: Diagnosis not present

## 2014-04-06 DIAGNOSIS — Z23 Encounter for immunization: Secondary | ICD-10-CM | POA: Diagnosis not present

## 2014-06-29 DIAGNOSIS — H35042 Retinal micro-aneurysms, unspecified, left eye: Secondary | ICD-10-CM | POA: Diagnosis not present

## 2014-06-29 DIAGNOSIS — H34832 Tributary (branch) retinal vein occlusion, left eye: Secondary | ICD-10-CM | POA: Diagnosis not present

## 2014-06-29 DIAGNOSIS — H35352 Cystoid macular degeneration, left eye: Secondary | ICD-10-CM | POA: Diagnosis not present

## 2014-07-26 DIAGNOSIS — H7202 Central perforation of tympanic membrane, left ear: Secondary | ICD-10-CM | POA: Diagnosis not present

## 2014-08-09 DIAGNOSIS — H35352 Cystoid macular degeneration, left eye: Secondary | ICD-10-CM | POA: Diagnosis not present

## 2014-08-09 DIAGNOSIS — H34832 Tributary (branch) retinal vein occlusion, left eye: Secondary | ICD-10-CM | POA: Diagnosis not present

## 2014-09-26 DIAGNOSIS — H34832 Tributary (branch) retinal vein occlusion, left eye: Secondary | ICD-10-CM | POA: Diagnosis not present

## 2014-09-29 ENCOUNTER — Other Ambulatory Visit (HOSPITAL_COMMUNITY): Payer: Self-pay | Admitting: Family Medicine

## 2014-09-29 DIAGNOSIS — M810 Age-related osteoporosis without current pathological fracture: Secondary | ICD-10-CM

## 2014-09-29 DIAGNOSIS — E785 Hyperlipidemia, unspecified: Secondary | ICD-10-CM | POA: Diagnosis not present

## 2014-09-29 DIAGNOSIS — Z0000 Encounter for general adult medical examination without abnormal findings: Secondary | ICD-10-CM | POA: Diagnosis not present

## 2014-09-29 DIAGNOSIS — Z79899 Other long term (current) drug therapy: Secondary | ICD-10-CM | POA: Diagnosis not present

## 2014-10-03 ENCOUNTER — Ambulatory Visit (HOSPITAL_COMMUNITY)
Admission: RE | Admit: 2014-10-03 | Discharge: 2014-10-03 | Disposition: A | Discharge status: Home / Self Care | Payer: Medicare Other | Source: Ambulatory Visit | Attending: Family Medicine | Admitting: Family Medicine

## 2014-10-03 DIAGNOSIS — M810 Age-related osteoporosis without current pathological fracture: Secondary | ICD-10-CM | POA: Diagnosis not present

## 2014-10-03 DIAGNOSIS — Z13820 Encounter for screening for osteoporosis: Secondary | ICD-10-CM | POA: Diagnosis not present

## 2014-11-14 DIAGNOSIS — H34832 Tributary (branch) retinal vein occlusion, left eye: Secondary | ICD-10-CM | POA: Diagnosis not present

## 2014-12-28 DIAGNOSIS — H34832 Tributary (branch) retinal vein occlusion, left eye: Secondary | ICD-10-CM | POA: Diagnosis not present

## 2015-02-01 DIAGNOSIS — H35352 Cystoid macular degeneration, left eye: Secondary | ICD-10-CM | POA: Diagnosis not present

## 2015-02-01 DIAGNOSIS — H34832 Tributary (branch) retinal vein occlusion, left eye: Secondary | ICD-10-CM | POA: Diagnosis not present

## 2015-03-10 DIAGNOSIS — H34832 Tributary (branch) retinal vein occlusion, left eye: Secondary | ICD-10-CM | POA: Diagnosis not present

## 2015-03-28 DIAGNOSIS — Z23 Encounter for immunization: Secondary | ICD-10-CM | POA: Diagnosis not present

## 2015-03-30 ENCOUNTER — Other Ambulatory Visit (HOSPITAL_COMMUNITY): Payer: Self-pay | Admitting: Family Medicine

## 2015-03-30 DIAGNOSIS — Z1231 Encounter for screening mammogram for malignant neoplasm of breast: Secondary | ICD-10-CM

## 2015-04-07 DIAGNOSIS — Z79899 Other long term (current) drug therapy: Secondary | ICD-10-CM | POA: Diagnosis not present

## 2015-04-07 DIAGNOSIS — E785 Hyperlipidemia, unspecified: Secondary | ICD-10-CM | POA: Diagnosis not present

## 2015-04-07 DIAGNOSIS — E039 Hypothyroidism, unspecified: Secondary | ICD-10-CM | POA: Diagnosis not present

## 2015-04-10 ENCOUNTER — Ambulatory Visit (HOSPITAL_COMMUNITY)
Admission: RE | Admit: 2015-04-10 | Discharge: 2015-04-10 | Disposition: A | Discharge status: Home / Self Care | Payer: Medicare Other | Source: Ambulatory Visit | Attending: Family Medicine | Admitting: Family Medicine

## 2015-04-10 DIAGNOSIS — Z1231 Encounter for screening mammogram for malignant neoplasm of breast: Secondary | ICD-10-CM | POA: Diagnosis not present

## 2015-04-11 DIAGNOSIS — H34832 Tributary (branch) retinal vein occlusion, left eye, with macular edema: Secondary | ICD-10-CM | POA: Diagnosis not present

## 2015-04-26 DIAGNOSIS — H34832 Tributary (branch) retinal vein occlusion, left eye, with macular edema: Secondary | ICD-10-CM | POA: Diagnosis not present

## 2015-05-15 DIAGNOSIS — H34832 Tributary (branch) retinal vein occlusion, left eye, with macular edema: Secondary | ICD-10-CM | POA: Diagnosis not present

## 2015-06-28 DIAGNOSIS — H35352 Cystoid macular degeneration, left eye: Secondary | ICD-10-CM | POA: Diagnosis not present

## 2015-06-28 DIAGNOSIS — H35042 Retinal micro-aneurysms, unspecified, left eye: Secondary | ICD-10-CM | POA: Diagnosis not present

## 2015-06-28 DIAGNOSIS — H34832 Tributary (branch) retinal vein occlusion, left eye, with macular edema: Secondary | ICD-10-CM | POA: Diagnosis not present

## 2015-08-02 DIAGNOSIS — H34832 Tributary (branch) retinal vein occlusion, left eye, with macular edema: Secondary | ICD-10-CM | POA: Diagnosis not present

## 2015-08-02 DIAGNOSIS — H35352 Cystoid macular degeneration, left eye: Secondary | ICD-10-CM | POA: Diagnosis not present

## 2015-09-06 DIAGNOSIS — H35352 Cystoid macular degeneration, left eye: Secondary | ICD-10-CM | POA: Diagnosis not present

## 2015-09-06 DIAGNOSIS — H34832 Tributary (branch) retinal vein occlusion, left eye, with macular edema: Secondary | ICD-10-CM | POA: Diagnosis not present

## 2015-09-21 ENCOUNTER — Encounter (HOSPITAL_COMMUNITY): Payer: Self-pay

## 2015-09-21 ENCOUNTER — Emergency Department (HOSPITAL_COMMUNITY)
Admission: EM | Admit: 2015-09-21 | Discharge: 2015-09-21 | Disposition: A | Discharge status: Home / Self Care | Payer: Medicare Other | Attending: Emergency Medicine | Admitting: Emergency Medicine

## 2015-09-21 ENCOUNTER — Emergency Department (HOSPITAL_COMMUNITY): Payer: Medicare Other

## 2015-09-21 DIAGNOSIS — Y999 Unspecified external cause status: Secondary | ICD-10-CM | POA: Insufficient documentation

## 2015-09-21 DIAGNOSIS — W1830XA Fall on same level, unspecified, initial encounter: Secondary | ICD-10-CM | POA: Diagnosis not present

## 2015-09-21 DIAGNOSIS — M1990 Unspecified osteoarthritis, unspecified site: Secondary | ICD-10-CM | POA: Insufficient documentation

## 2015-09-21 DIAGNOSIS — M25561 Pain in right knee: Secondary | ICD-10-CM | POA: Diagnosis not present

## 2015-09-21 DIAGNOSIS — M25461 Effusion, right knee: Secondary | ICD-10-CM | POA: Diagnosis not present

## 2015-09-21 DIAGNOSIS — S8991XA Unspecified injury of right lower leg, initial encounter: Secondary | ICD-10-CM | POA: Insufficient documentation

## 2015-09-21 DIAGNOSIS — R000 Tachycardia, unspecified: Secondary | ICD-10-CM | POA: Diagnosis not present

## 2015-09-21 DIAGNOSIS — E039 Hypothyroidism, unspecified: Secondary | ICD-10-CM | POA: Diagnosis not present

## 2015-09-21 DIAGNOSIS — Y939 Activity, unspecified: Secondary | ICD-10-CM | POA: Diagnosis not present

## 2015-09-21 DIAGNOSIS — Y92007 Garden or yard of unspecified non-institutional (private) residence as the place of occurrence of the external cause: Secondary | ICD-10-CM | POA: Insufficient documentation

## 2015-09-21 MED ORDER — OXYCODONE-ACETAMINOPHEN 5-325 MG PO TABS: 2.0000 | ORAL_TABLET | ORAL | Status: DC | PRN

## 2015-09-21 MED ORDER — OXYCODONE-ACETAMINOPHEN 5-325 MG PO TABS
1.0000 | ORAL_TABLET | Freq: Once | ORAL | Status: AC
  Administered 2015-09-21: 1 via ORAL
  Filled 2015-09-21: qty 1

## 2015-09-21 NOTE — ED Provider Notes (Signed)
CSN: [id number]     Arrival date & time 09/21/15  1948 History   First MD Initiated Contact with Patient 09/21/15 2053     Chief Complaint  Patient presents with  . Knee Pain     (Consider location/radiation/quality/duration/timing/severity/associated sxs/prior Treatment) HPI Carrie Osborne is a 75 y.o. female who presents to the ED with right knee pain that radiates to the right hip s/p fall yesterday. She reports that she was in her flower garden and made a quick turn and and fell on her right side. She denies head injury or LOC. Patient reports that she had pain yesterday but today she has been unable to bear weight on the right leg.  Past Medical History  Diagnosis Date  . Hypothyroidism   . Baker's cyst   . Arthritis   . Osteoarthritis    Past Surgical History  Procedure Laterality Date  . Abdominal hysterectomy    . Tonsillectomy     No family history on file. Social History  Substance Use Topics  . Smoking status: Never Smoker   . Smokeless tobacco: None  . Alcohol Use: No   OB History    No data available     Review of Systems Negative except as stated in HPI   Allergies  Penicillins  Home Medications   Prior to Admission medications   Medication Sig Start Date End Date Taking? Authorizing Provider  oxyCODONE-acetaminophen (PERCOCET/ROXICET) 5-325 MG tablet Take 2 tablets by mouth every 4 (four) hours as needed for severe pain. 09/21/15   Hope Bunnie Pion, NP  PredniSONE 10 MG KIT Take 1 kit (10 mg total) by mouth as directed. 11/12/12   Carole Civil, MD   BP 148/78 mmHg  Pulse 111  Temp(Src) 98.9 F (37.2 C) (Oral)  Resp 20  Ht '5\' 3"'  (1.6 m)  Wt 72.576 kg  BMI 28.35 kg/m2  SpO2 94% Physical Exam  Constitutional: She is oriented to person, place, and time. She appears well-developed and well-nourished.  HENT:  Head: Normocephalic.  Eyes: EOM are normal.  Neck: Neck supple.  Cardiovascular: Tachycardia present.   Pulmonary/Chest: Effort  normal.  Musculoskeletal:       Right knee: She exhibits decreased range of motion and swelling. She exhibits no deformity, no laceration and no erythema. Tenderness found.       Legs: Distal pulses 2+, adequate circulation  Neurological: She is alert and oriented to person, place, and time. No cranial nerve deficit.  Skin: Skin is warm and dry.  Psychiatric: She has a normal mood and affect. Her behavior is normal.  Nursing note and vitals reviewed.   ED Course  Procedures  Dr. Dayna Barker in to examine the patient. CT of knee ordered.  Ct Knee Right Wo Contrast  09/21/2015  CLINICAL DATA:  75 year old female with right knee pain EXAM: CT OF THE right KNEE WITHOUT CONTRAST TECHNIQUE: Multidetector CT imaging of the right knee was performed according to the standard protocol. Multiplanar CT image reconstructions were also generated. COMPARISON:  Radiograph dated 09/21/2015 FINDINGS: There is no acute fracture or dislocation. The bones are osteopenic. There is osteoarthritic changes of the knee with narrowing of the medial compartment, subchondral sclerosis, and bone spurring. Trace suprapatellar effusion noted. A 3.0 x 2.0 cm low attenuating/ cystic structure in the posteriomedial aspect of the the most compatible with a Baker's cyst. Ultrasound may provide better evaluation. The soft tissues are otherwise unremarkable. IMPRESSION: No acute fracture or dislocation. Osteoarthritic changes of the knee. Small  suprapatellar effusion and a small Baker's cyst. Electronically Signed   By: Anner Crete M.D.   On: 09/21/2015 22:04   Dg Knee Complete 4 Views Right  09/21/2015  CLINICAL DATA:  Right popliteal knee pain following fall yesterday Pt stated she tripped and fell down the last step into her basement. She fell to her right side and landed on the basement floor. She said it happened so fast and does not remember if her knee twisted or if her knee hit the floor. Hx of arthritis/osteoarthrits No  previous injury EXAM: RIGHT KNEE - COMPLETE 4+ VIEW COMPARISON:  None. FINDINGS: No fracture.  No dislocation.  No bone lesion. Mild medial joint space compartment narrowing. Small marginal osteophytes all 3 compartments. Bones are demineralized. No joint effusion.  Soft tissues are unremarkable. IMPRESSION: No fracture or acute finding. Electronically Signed   By: Lajean Manes M.D.   On: 09/21/2015 20:50    CT without acute findings, discussed with Dr. Dayna Barker Will place in knee immobilizer and give Percocet prepack and pt. To see ortho tomorrow.  MDM  75 y.o. female with right knee pain s/p fall yesterday stable for d/c with no fracture or acute findings on x-ray and CT. She will f/u with Dr. Aline Brochure tomorrow.   Final diagnoses:  Knee injury, right, initial encounter       [facility], NP 09/21/15 [place], MD 09/25/15 [phone]

## 2015-09-21 NOTE — ED Notes (Signed)
Pt reports falling yesterday in her back yard and landing on right side.  C/o of right knee pain since falling.

## 2015-09-21 NOTE — ED Provider Notes (Signed)
Medical screening examination/treatment/procedure(s) were conducted as a shared visit with non-physician practitioner(s) and myself.  I personally evaluated the patient during the encounter.  Knee pain after fall. Difficulty ambulating.  K nee is stable on my exam, mild swelling, no e/o infection.  Plan for CT of knee as radiographs are negative. If CT ok, already has ortho follow up in AM will need pain control.   Merrily Pew, MD 09/25/15 [phone]

## 2015-09-21 NOTE — Discharge Instructions (Signed)
The narcotic pain medication will make you sleepy. Do not try to get up without help.

## 2015-09-22 ENCOUNTER — Ambulatory Visit (INDEPENDENT_AMBULATORY_CARE_PROVIDER_SITE_OTHER): Payer: Medicare Other | Admitting: Orthopedic Surgery

## 2015-09-22 VITALS — BP 128/79 | Ht 63.0 in | Wt 160.0 lb

## 2015-09-22 DIAGNOSIS — S83511A Sprain of anterior cruciate ligament of right knee, initial encounter: Secondary | ICD-10-CM | POA: Diagnosis not present

## 2015-09-22 MED ORDER — NAPROXEN 500 MG PO TABS: 500.0000 mg | ORAL_TABLET | Freq: Two times a day (BID) | ORAL | Status: DC

## 2015-09-22 MED ORDER — ACETAMINOPHEN-CODEINE #3 300-30 MG PO TABS: 1.0000 | ORAL_TABLET | ORAL | Status: DC | PRN

## 2015-09-22 NOTE — Patient Instructions (Addendum)
Ice 3 x a day for 20 min   Wear knee brace for support   Use walker for walking   Meds ordered this encounter  Medications  . acetaminophen-codeine (TYLENOL #3) 300-30 MG tablet    Sig: Take 1 tablet by mouth every 4 (four) hours as needed for moderate pain.    Dispense:  30 tablet    Refill:  0  . naproxen (NAPROSYN) 500 MG tablet    Sig: Take 1 tablet (500 mg total) by mouth 2 (two) times daily with a meal.    Dispense:  60 tablet    Refill:  2   Knee Sprain A knee sprain is a tear in the strong bands of tissue that connect the bones (ligaments) of your knee. HOME CARE  Raise (elevate) your injured knee to lessen puffiness (swelling).  To ease pain and puffiness, put ice on the injured area.  Put ice in a plastic bag.  Place a towel between your skin and the bag.  Leave the ice on for 20 minutes, 2-3 times a day.  Only take medicine as told by your doctor.  Do not leave your knee unprotected until pain and stiffness go away (usually 4-6 weeks).  If you have a cast or splint, do not get it wet. If your doctor told you to not take it off, cover it with a plastic bag when you shower or bathe. Do not swim.  Your doctor may have you do exercises to prevent or limit permanent weakness and stiffness. GET HELP RIGHT AWAY IF:   Your cast or splint becomes damaged.  Your pain gets worse.  You have a lot of pain, puffiness, or numbness below the cast or splint. MAKE SURE YOU:   Understand these instructions.  Will watch your condition.  Will get help right away if you are not doing well or get worse.   This information is not intended to replace advice given to you by your health care provider. Make sure you discuss any questions you have with your health care provider.   Document Released: 05/29/2009 Document Revised: 06/15/2013 Document Reviewed: 02/16/2013 Elsevier Interactive Patient Education [employer].

## 2015-09-22 NOTE — Progress Notes (Signed)
Chief Complaint  Patient presents with  . Follow-up    ER follow up on right knee. DOI 09-20-15   HPI 75 year old female fell in her yard dealing with some plants presents with a three-day history of severe right knee pain inability to weight-bear swelling. The pain is in the back of the knee as well as the frontal more so on the back has painful range of motion and constant pain  Review of Systems  Constitutional: Negative for fever, chills, weight loss, malaise/fatigue and diaphoresis.  Respiratory: Negative for shortness of breath.   Cardiovascular: Negative for chest pain.  Gastrointestinal: Negative for nausea and vomiting.  Neurological: Negative for tingling.    Past Medical History  Diagnosis Date  . Hypothyroidism   . Baker's cyst   . Arthritis   . Osteoarthritis     Past Surgical History  Procedure Laterality Date  . Abdominal hysterectomy    . Tonsillectomy     No family history on file. Social History  Substance Use Topics  . Smoking status: Never Smoker   . Smokeless tobacco: Not on file  . Alcohol Use: No    Current outpatient prescriptions:  .  acetaminophen-codeine (TYLENOL #3) 300-30 MG tablet, Take 1 tablet by mouth every 4 (four) hours as needed for moderate pain., Disp: 30 tablet, Rfl: 0 .  naproxen (NAPROSYN) 500 MG tablet, Take 1 tablet (500 mg total) by mouth 2 (two) times daily with a meal., Disp: 60 tablet, Rfl: 2 .  oxyCODONE-acetaminophen (PERCOCET/ROXICET) 5-325 MG tablet, Take 2 tablets by mouth every 4 (four) hours as needed for severe pain., Disp: 6 tablet, Rfl: 0 .  PredniSONE 10 MG KIT, Take 1 kit (10 mg total) by mouth as directed., Disp: 1 kit, Rfl: 0  BP 128/79 mmHg  Ht '5\' 3"'  (1.6 m)  Wt 160 lb (72.576 kg)  BMI 28.35 kg/m2  Physical Exam  Constitutional: She is oriented to person, place, and time. She appears well-developed and well-nourished. No distress.  Cardiovascular: Normal rate and intact distal pulses.   Musculoskeletal:    Left knee no swelling, alignment is normal. Range of motion is full. Knee is stable. Strength is normal. Skin normal. Temperature and normal no edema. Sensation normal.  Right knee effusion. Flexion limited 90. Anterior drawer test is positive posterior drawer test is negative collateral ligaments are stable strength tests are normal skin is intact pulses are good distally and sensation is normal  Patient is ambulating but with a straight leg brace and crutches.  Neurological: She is alert and oriented to person, place, and time. She has normal reflexes. She exhibits normal muscle tone. Coordination normal.  Skin: Skin is warm and dry. No rash noted. She is not diaphoretic. No erythema. No pallor.  Psychiatric: She has a normal mood and affect. Her behavior is normal. Judgment and thought content normal.    Ortho Exam   ASSESSMENT: My personal interpretation of the images:  X-rays were done of the knee showed mild arthritis. My interpretation is that there is no fracture and is mild arthritis  CAT scan did not show any other pathology and I was able to review that on the computer imaging  PLAN Encounter Diagnosis  Name Primary?  . ACL (anterior cruciate ligament) rupture, right, initial encounter Yes   MRI ordered to evaluate anterior cruciate ligament for tear and possible surgical intervention.  Ice 3 x a day for 20 min   Wear knee brace for support   Use walker for  walking   Meds ordered this encounter  Medications  . acetaminophen-codeine (TYLENOL #3) 300-30 MG tablet    Sig: Take 1 tablet by mouth every 4 (four) hours as needed for moderate pain.    Dispense:  30 tablet    Refill:  0  . naproxen (NAPROSYN) 500 MG tablet    Sig: Take 1 tablet (500 mg total) by mouth 2 (two) times daily with a meal.    Dispense:  60 tablet    Refill:  2   Knee Sprain A knee sprain is a tear in the strong bands of tissue that connect the bones (ligaments) of your knee. HOME  CARE  Raise (elevate) your injured knee to lessen puffiness (swelling).  To ease pain and puffiness, put ice on the injured area.  Put ice in a plastic bag.  Place a towel between your skin and the bag.  Leave the ice on for 20 minutes, 2-3 times a day.  Only take medicine as told by your doctor.  Do not leave your knee unprotected until pain and stiffness go away (usually 4-6 weeks).  If you have a cast or splint, do not get it wet. If your doctor told you to not take it off, cover it with a plastic bag when you shower or bathe. Do not swim.  Your doctor may have you do exercises to prevent or limit permanent weakness and stiffness. GET HELP RIGHT AWAY IF:   Your cast or splint becomes damaged.  Your pain gets worse.  You have a lot of pain, puffiness, or numbness below the cast or splint. MAKE SURE YOU:   Understand these instructions.  Will watch your condition.  Will get help right away if you are not doing well or get worse.   This information is not intended to replace advice given to you by your health care provider. Make sure you discuss any questions you have with your health care provider.   Document Released: 05/29/2009 Document Revised: 06/15/2013 Document Reviewed: 02/16/2013 Elsevier Interactive Patient Education [employer].

## 2015-09-29 ENCOUNTER — Ambulatory Visit (HOSPITAL_COMMUNITY)
Admission: RE | Admit: 2015-09-29 | Discharge: 2015-09-29 | Disposition: A | Discharge status: Home / Self Care | Payer: Medicare Other | Source: Ambulatory Visit | Attending: Orthopedic Surgery | Admitting: Orthopedic Surgery

## 2015-09-29 DIAGNOSIS — S83281A Other tear of lateral meniscus, current injury, right knee, initial encounter: Secondary | ICD-10-CM | POA: Insufficient documentation

## 2015-09-29 DIAGNOSIS — X58XXXA Exposure to other specified factors, initial encounter: Secondary | ICD-10-CM | POA: Insufficient documentation

## 2015-09-29 DIAGNOSIS — S83241A Other tear of medial meniscus, current injury, right knee, initial encounter: Secondary | ICD-10-CM | POA: Insufficient documentation

## 2015-09-29 DIAGNOSIS — M25561 Pain in right knee: Secondary | ICD-10-CM | POA: Insufficient documentation

## 2015-09-29 DIAGNOSIS — S83511A Sprain of anterior cruciate ligament of right knee, initial encounter: Secondary | ICD-10-CM

## 2015-09-29 DIAGNOSIS — S82144A Nondisplaced bicondylar fracture of right tibia, initial encounter for closed fracture: Secondary | ICD-10-CM | POA: Diagnosis not present

## 2015-09-29 MED FILL — Oxycodone w/ Acetaminophen Tab 5-325 MG: ORAL | Qty: 6 | Status: AC

## 2015-10-11 ENCOUNTER — Ambulatory Visit (INDEPENDENT_AMBULATORY_CARE_PROVIDER_SITE_OTHER): Payer: Medicare Other | Admitting: Orthopedic Surgery

## 2015-10-11 ENCOUNTER — Encounter: Payer: Self-pay | Admitting: Orthopedic Surgery

## 2015-10-11 VITALS — BP 134/92 | Ht 66.0 in | Wt 190.0 lb

## 2015-10-11 DIAGNOSIS — M1711 Unilateral primary osteoarthritis, right knee: Secondary | ICD-10-CM

## 2015-10-11 DIAGNOSIS — H34832 Tributary (branch) retinal vein occlusion, left eye, with macular edema: Secondary | ICD-10-CM | POA: Diagnosis not present

## 2015-10-11 DIAGNOSIS — M8430XD Stress fracture, unspecified site, subsequent encounter for fracture with routine healing: Secondary | ICD-10-CM | POA: Diagnosis not present

## 2015-10-11 NOTE — Progress Notes (Signed)
Patient ID: Carrie Osborne, female   DOB: [birth year], 75 y.o.   MRN: [id number]  Chief Complaint  Patient presents with  . Follow-up    MRI REVIEW RIGHT KNEE    HPI KNEE PAIN FU AFTER MRI   ROS-NORMAL MSK AND NEURO   BP 134/92 mmHg  Ht 5\' 6"  (1.676 m)  Wt 190 lb (86.183 kg)  BMI 30.68 kg/m2  Physical Exam  Constitutional: She is oriented to person, place, and time. She appears well-developed and well-nourished. No distress.  Cardiovascular: Normal rate and intact distal pulses.   Neurological: She is alert and oriented to person, place, and time. She has normal reflexes. She exhibits normal muscle tone. Coordination normal.  Skin: Skin is warm and dry. No rash noted. She is not diaphoretic. No erythema. No pallor.  Psychiatric: She has a normal mood and affect. Her behavior is normal. Judgment and thought content normal.    Right Knee Exam  Right knee exam is normal.  Tenderness  The patient is experiencing no tenderness.     Range of Motion  The patient has normal right knee ROM.  Muscle Strength   The patient has normal right knee strength.  Tests  Drawer:       Anterior - negative    Posterior - negative        ASSESSMENT AND PLAN  I SEE ON THE MRI STRESS FRACTURE 3 COMP OA  REPORT AGREES  SHE S ASYMPTOMATIC SO WE AGREE TO RESUME NORMAL ACTIVITY WITH NO EXCESSIVE WALKING

## 2015-10-11 NOTE — Patient Instructions (Signed)
Regular activity 

## 2015-10-18 DIAGNOSIS — E039 Hypothyroidism, unspecified: Secondary | ICD-10-CM | POA: Diagnosis not present

## 2015-10-18 DIAGNOSIS — Z0000 Encounter for general adult medical examination without abnormal findings: Secondary | ICD-10-CM | POA: Diagnosis not present

## 2015-10-18 DIAGNOSIS — E785 Hyperlipidemia, unspecified: Secondary | ICD-10-CM | POA: Diagnosis not present

## 2015-10-30 DIAGNOSIS — E039 Hypothyroidism, unspecified: Secondary | ICD-10-CM | POA: Diagnosis not present

## 2015-10-30 DIAGNOSIS — M545 Low back pain: Secondary | ICD-10-CM | POA: Diagnosis not present

## 2015-10-30 DIAGNOSIS — E785 Hyperlipidemia, unspecified: Secondary | ICD-10-CM | POA: Diagnosis not present

## 2015-10-30 DIAGNOSIS — M179 Osteoarthritis of knee, unspecified: Secondary | ICD-10-CM | POA: Diagnosis not present

## 2015-10-30 DIAGNOSIS — R739 Hyperglycemia, unspecified: Secondary | ICD-10-CM | POA: Diagnosis not present

## 2015-11-15 ENCOUNTER — Ambulatory Visit (INDEPENDENT_AMBULATORY_CARE_PROVIDER_SITE_OTHER): Payer: Medicare Other | Admitting: Orthopedic Surgery

## 2015-11-15 VITALS — BP 149/86 | HR 94 | Ht 66.0 in | Wt 187.0 lb

## 2015-11-15 DIAGNOSIS — H34811 Central retinal vein occlusion, right eye, with macular edema: Secondary | ICD-10-CM | POA: Diagnosis not present

## 2015-11-15 DIAGNOSIS — M8430XD Stress fracture, unspecified site, subsequent encounter for fracture with routine healing: Secondary | ICD-10-CM

## 2015-11-15 DIAGNOSIS — H34832 Tributary (branch) retinal vein occlusion, left eye, with macular edema: Secondary | ICD-10-CM | POA: Diagnosis not present

## 2015-11-15 NOTE — Progress Notes (Signed)
Patient ID: Carrie Osborne, female   DOB: 07-Sep-1940, 75 y.o.   MRN: [id number]  Chief Complaint  Patient presents with  . Follow-up    Right knee injury DOI 09/20/15    HPI STRESS Actual right knee  Treated with bracing  No complaints  Review of Systems  Constitutional: Negative for fever and chills.  Neurological: Negative for tingling.    BP 149/86 mmHg  Pulse 94  Ht 5\' 6"  (1.676 m)  Wt 187 lb (84.823 kg)  BMI 30.20 kg/m2 Gen. appearance is normal grooming and hygiene Orientation to person place and time normal Mood normal Gait is normal  No peripheral edema or swelling is noted in the right leg Sensory exam shows normal sensation to palpation, pressure and soft touch Skin exam no lacerations ulcerations or erythema  Right Knee Exam   Range of Motion  The patient has normal right knee ROM.  Muscle Strength   The patient has normal right knee strength.  Tests  McMurray:  Medial - negative Lateral - negative Drawer:       Anterior - negative    Posterior - negative Varus: negative Valgus: negative  Other  Erythema: absent Scars: absent Sensation: normal Pulse: present Swelling: none       A/P  Medical decision-making  DOING WELL  RELEASED TO NORMAL ACTIVITY

## 2015-11-15 NOTE — Patient Instructions (Signed)
NORMAL ACTIVITY

## 2015-12-21 DIAGNOSIS — H34832 Tributary (branch) retinal vein occlusion, left eye, with macular edema: Secondary | ICD-10-CM | POA: Diagnosis not present

## 2016-01-25 DIAGNOSIS — H35352 Cystoid macular degeneration, left eye: Secondary | ICD-10-CM | POA: Diagnosis not present

## 2016-01-25 DIAGNOSIS — H34832 Tributary (branch) retinal vein occlusion, left eye, with macular edema: Secondary | ICD-10-CM | POA: Diagnosis not present

## 2016-01-25 DIAGNOSIS — H35042 Retinal micro-aneurysms, unspecified, left eye: Secondary | ICD-10-CM | POA: Diagnosis not present

## 2016-02-01 DIAGNOSIS — X32XXXD Exposure to sunlight, subsequent encounter: Secondary | ICD-10-CM | POA: Diagnosis not present

## 2016-02-01 DIAGNOSIS — Z85828 Personal history of other malignant neoplasm of skin: Secondary | ICD-10-CM | POA: Diagnosis not present

## 2016-02-01 DIAGNOSIS — Z08 Encounter for follow-up examination after completed treatment for malignant neoplasm: Secondary | ICD-10-CM | POA: Diagnosis not present

## 2016-02-01 DIAGNOSIS — Z1283 Encounter for screening for malignant neoplasm of skin: Secondary | ICD-10-CM | POA: Diagnosis not present

## 2016-02-01 DIAGNOSIS — L821 Other seborrheic keratosis: Secondary | ICD-10-CM | POA: Diagnosis not present

## 2016-02-01 DIAGNOSIS — L218 Other seborrheic dermatitis: Secondary | ICD-10-CM | POA: Diagnosis not present

## 2016-02-01 DIAGNOSIS — L570 Actinic keratosis: Secondary | ICD-10-CM | POA: Diagnosis not present

## 2016-02-29 DIAGNOSIS — H34832 Tributary (branch) retinal vein occlusion, left eye, with macular edema: Secondary | ICD-10-CM | POA: Diagnosis not present

## 2016-03-15 DIAGNOSIS — Z23 Encounter for immunization: Secondary | ICD-10-CM | POA: Diagnosis not present

## 2016-03-18 ENCOUNTER — Other Ambulatory Visit (HOSPITAL_COMMUNITY): Payer: Self-pay | Admitting: Pulmonary Disease

## 2016-03-18 DIAGNOSIS — Z1231 Encounter for screening mammogram for malignant neoplasm of breast: Secondary | ICD-10-CM

## 2016-04-08 DIAGNOSIS — H34832 Tributary (branch) retinal vein occlusion, left eye, with macular edema: Secondary | ICD-10-CM | POA: Diagnosis not present

## 2016-04-11 ENCOUNTER — Ambulatory Visit (HOSPITAL_COMMUNITY)
Admission: RE | Admit: 2016-04-11 | Discharge: 2016-04-11 | Disposition: A | Discharge status: Home / Self Care | Payer: Medicare Other | Source: Ambulatory Visit | Attending: Pulmonary Disease | Admitting: Pulmonary Disease

## 2016-04-11 DIAGNOSIS — Z1231 Encounter for screening mammogram for malignant neoplasm of breast: Secondary | ICD-10-CM

## 2016-04-24 DIAGNOSIS — E785 Hyperlipidemia, unspecified: Secondary | ICD-10-CM | POA: Diagnosis not present

## 2016-04-24 DIAGNOSIS — M545 Low back pain: Secondary | ICD-10-CM | POA: Diagnosis not present

## 2016-04-24 DIAGNOSIS — M179 Osteoarthritis of knee, unspecified: Secondary | ICD-10-CM | POA: Diagnosis not present

## 2016-04-24 DIAGNOSIS — E039 Hypothyroidism, unspecified: Secondary | ICD-10-CM | POA: Diagnosis not present

## 2016-05-01 DIAGNOSIS — M179 Osteoarthritis of knee, unspecified: Secondary | ICD-10-CM | POA: Diagnosis not present

## 2016-05-01 DIAGNOSIS — E785 Hyperlipidemia, unspecified: Secondary | ICD-10-CM | POA: Diagnosis not present

## 2016-05-01 DIAGNOSIS — Z23 Encounter for immunization: Secondary | ICD-10-CM | POA: Diagnosis not present

## 2016-05-01 DIAGNOSIS — E039 Hypothyroidism, unspecified: Secondary | ICD-10-CM | POA: Diagnosis not present

## 2016-05-01 DIAGNOSIS — M545 Low back pain: Secondary | ICD-10-CM | POA: Diagnosis not present

## 2016-05-06 ENCOUNTER — Telehealth: Payer: Self-pay | Admitting: Internal Medicine

## 2016-05-06 NOTE — Telephone Encounter (Signed)
RECALL FOR TCS °

## 2016-05-06 NOTE — Telephone Encounter (Signed)
Letter sent.

## 2016-05-20 DIAGNOSIS — H34832 Tributary (branch) retinal vein occlusion, left eye, with macular edema: Secondary | ICD-10-CM | POA: Diagnosis not present

## 2016-06-27 DIAGNOSIS — H34832 Tributary (branch) retinal vein occlusion, left eye, with macular edema: Secondary | ICD-10-CM | POA: Diagnosis not present

## 2016-08-08 DIAGNOSIS — H34832 Tributary (branch) retinal vein occlusion, left eye, with macular edema: Secondary | ICD-10-CM | POA: Diagnosis not present

## 2016-09-19 DIAGNOSIS — H34832 Tributary (branch) retinal vein occlusion, left eye, with macular edema: Secondary | ICD-10-CM | POA: Diagnosis not present

## 2016-10-03 DIAGNOSIS — C44321 Squamous cell carcinoma of skin of nose: Secondary | ICD-10-CM | POA: Diagnosis not present

## 2016-10-03 DIAGNOSIS — L570 Actinic keratosis: Secondary | ICD-10-CM | POA: Diagnosis not present

## 2016-10-03 DIAGNOSIS — X32XXXD Exposure to sunlight, subsequent encounter: Secondary | ICD-10-CM | POA: Diagnosis not present

## 2016-10-21 DIAGNOSIS — M545 Low back pain: Secondary | ICD-10-CM | POA: Diagnosis not present

## 2016-10-21 DIAGNOSIS — E785 Hyperlipidemia, unspecified: Secondary | ICD-10-CM | POA: Diagnosis not present

## 2016-10-21 DIAGNOSIS — M179 Osteoarthritis of knee, unspecified: Secondary | ICD-10-CM | POA: Diagnosis not present

## 2016-10-21 DIAGNOSIS — E039 Hypothyroidism, unspecified: Secondary | ICD-10-CM | POA: Diagnosis not present

## 2016-10-29 DIAGNOSIS — H3562 Retinal hemorrhage, left eye: Secondary | ICD-10-CM | POA: Diagnosis not present

## 2016-10-29 DIAGNOSIS — M545 Low back pain: Secondary | ICD-10-CM | POA: Diagnosis not present

## 2016-10-29 DIAGNOSIS — E039 Hypothyroidism, unspecified: Secondary | ICD-10-CM | POA: Diagnosis not present

## 2016-10-29 DIAGNOSIS — M179 Osteoarthritis of knee, unspecified: Secondary | ICD-10-CM | POA: Diagnosis not present

## 2016-10-31 DIAGNOSIS — H34832 Tributary (branch) retinal vein occlusion, left eye, with macular edema: Secondary | ICD-10-CM | POA: Diagnosis not present

## 2016-11-04 DIAGNOSIS — Z08 Encounter for follow-up examination after completed treatment for malignant neoplasm: Secondary | ICD-10-CM | POA: Diagnosis not present

## 2016-11-04 DIAGNOSIS — Z85828 Personal history of other malignant neoplasm of skin: Secondary | ICD-10-CM | POA: Diagnosis not present

## 2016-11-04 DIAGNOSIS — D225 Melanocytic nevi of trunk: Secondary | ICD-10-CM | POA: Diagnosis not present

## 2016-12-12 DIAGNOSIS — H35042 Retinal micro-aneurysms, unspecified, left eye: Secondary | ICD-10-CM | POA: Diagnosis not present

## 2016-12-12 DIAGNOSIS — H35352 Cystoid macular degeneration, left eye: Secondary | ICD-10-CM | POA: Diagnosis not present

## 2016-12-12 DIAGNOSIS — H43813 Vitreous degeneration, bilateral: Secondary | ICD-10-CM | POA: Diagnosis not present

## 2016-12-12 DIAGNOSIS — H34832 Tributary (branch) retinal vein occlusion, left eye, with macular edema: Secondary | ICD-10-CM | POA: Diagnosis not present

## 2017-01-23 DIAGNOSIS — H34832 Tributary (branch) retinal vein occlusion, left eye, with macular edema: Secondary | ICD-10-CM | POA: Diagnosis not present

## 2017-02-11 DIAGNOSIS — L255 Unspecified contact dermatitis due to plants, except food: Secondary | ICD-10-CM | POA: Diagnosis not present

## 2017-02-27 DIAGNOSIS — R21 Rash and other nonspecific skin eruption: Secondary | ICD-10-CM | POA: Diagnosis not present

## 2017-02-27 DIAGNOSIS — T63791A Toxic effect of contact with other venomous plant, accidental (unintentional), initial encounter: Secondary | ICD-10-CM | POA: Diagnosis not present

## 2017-03-13 DIAGNOSIS — Z85828 Personal history of other malignant neoplasm of skin: Secondary | ICD-10-CM | POA: Diagnosis not present

## 2017-03-13 DIAGNOSIS — D225 Melanocytic nevi of trunk: Secondary | ICD-10-CM | POA: Diagnosis not present

## 2017-03-13 DIAGNOSIS — Z08 Encounter for follow-up examination after completed treatment for malignant neoplasm: Secondary | ICD-10-CM | POA: Diagnosis not present

## 2017-03-18 DIAGNOSIS — H2513 Age-related nuclear cataract, bilateral: Secondary | ICD-10-CM | POA: Diagnosis not present

## 2017-03-18 DIAGNOSIS — H35352 Cystoid macular degeneration, left eye: Secondary | ICD-10-CM | POA: Diagnosis not present

## 2017-03-18 DIAGNOSIS — H35042 Retinal micro-aneurysms, unspecified, left eye: Secondary | ICD-10-CM | POA: Diagnosis not present

## 2017-03-18 DIAGNOSIS — H34832 Tributary (branch) retinal vein occlusion, left eye, with macular edema: Secondary | ICD-10-CM | POA: Diagnosis not present

## 2017-04-10 ENCOUNTER — Other Ambulatory Visit (HOSPITAL_COMMUNITY): Payer: Self-pay | Admitting: Pulmonary Disease

## 2017-04-10 DIAGNOSIS — Z1231 Encounter for screening mammogram for malignant neoplasm of breast: Secondary | ICD-10-CM

## 2017-04-16 ENCOUNTER — Ambulatory Visit (HOSPITAL_COMMUNITY)
Admission: RE | Admit: 2017-04-16 | Discharge: 2017-04-16 | Disposition: A | Discharge status: Home / Self Care | Payer: Medicare Other | Source: Ambulatory Visit | Attending: Pulmonary Disease | Admitting: Pulmonary Disease

## 2017-04-16 DIAGNOSIS — Z1231 Encounter for screening mammogram for malignant neoplasm of breast: Secondary | ICD-10-CM | POA: Diagnosis not present

## 2017-04-16 DIAGNOSIS — Z23 Encounter for immunization: Secondary | ICD-10-CM | POA: Diagnosis not present

## 2017-04-24 DIAGNOSIS — M545 Low back pain: Secondary | ICD-10-CM | POA: Diagnosis not present

## 2017-04-24 DIAGNOSIS — H3562 Retinal hemorrhage, left eye: Secondary | ICD-10-CM | POA: Diagnosis not present

## 2017-04-24 DIAGNOSIS — E785 Hyperlipidemia, unspecified: Secondary | ICD-10-CM | POA: Diagnosis not present

## 2017-04-24 DIAGNOSIS — E669 Obesity, unspecified: Secondary | ICD-10-CM | POA: Diagnosis not present

## 2017-04-24 DIAGNOSIS — M179 Osteoarthritis of knee, unspecified: Secondary | ICD-10-CM | POA: Diagnosis not present

## 2017-04-24 DIAGNOSIS — E039 Hypothyroidism, unspecified: Secondary | ICD-10-CM | POA: Diagnosis not present

## 2017-04-29 DIAGNOSIS — E039 Hypothyroidism, unspecified: Secondary | ICD-10-CM | POA: Diagnosis not present

## 2017-04-29 DIAGNOSIS — R194 Change in bowel habit: Secondary | ICD-10-CM | POA: Diagnosis not present

## 2017-04-29 DIAGNOSIS — M545 Low back pain: Secondary | ICD-10-CM | POA: Diagnosis not present

## 2017-04-29 DIAGNOSIS — E785 Hyperlipidemia, unspecified: Secondary | ICD-10-CM | POA: Diagnosis not present

## 2017-05-01 ENCOUNTER — Ambulatory Visit: Payer: Medicare Other | Admitting: Gastroenterology

## 2017-05-06 DIAGNOSIS — H35042 Retinal micro-aneurysms, unspecified, left eye: Secondary | ICD-10-CM | POA: Diagnosis not present

## 2017-05-06 DIAGNOSIS — H2513 Age-related nuclear cataract, bilateral: Secondary | ICD-10-CM | POA: Diagnosis not present

## 2017-05-06 DIAGNOSIS — H35352 Cystoid macular degeneration, left eye: Secondary | ICD-10-CM | POA: Diagnosis not present

## 2017-05-06 DIAGNOSIS — H34832 Tributary (branch) retinal vein occlusion, left eye, with macular edema: Secondary | ICD-10-CM | POA: Diagnosis not present

## 2017-05-28 ENCOUNTER — Ambulatory Visit (INDEPENDENT_AMBULATORY_CARE_PROVIDER_SITE_OTHER): Payer: Medicare Other | Admitting: Nurse Practitioner

## 2017-05-28 ENCOUNTER — Encounter: Payer: Self-pay | Admitting: *Deleted

## 2017-05-28 ENCOUNTER — Encounter: Payer: Self-pay | Admitting: Nurse Practitioner

## 2017-05-28 ENCOUNTER — Other Ambulatory Visit: Payer: Self-pay | Admitting: *Deleted

## 2017-05-28 DIAGNOSIS — K5900 Constipation, unspecified: Secondary | ICD-10-CM | POA: Diagnosis not present

## 2017-05-28 DIAGNOSIS — Z1211 Encounter for screening for malignant neoplasm of colon: Secondary | ICD-10-CM

## 2017-05-28 MED ORDER — NA SULFATE-K SULFATE-MG SULF 17.5-3.13-1.6 GM/177ML PO SOLN: 1.0000 | ORAL | 0 refills | Status: DC

## 2017-05-28 NOTE — Progress Notes (Signed)
Primary Care Physician:  Sinda Du, MD Primary Gastroenterologist:  Dr. Gala Romney  Chief Complaint  Patient presents with  . Constipation    some better w/high fiber diet; due for tcs    HPI:   Carrie Osborne is a 76 y.o. female who presents for constipation as well as schedule routine 10-year repeat colonoscopy.  Colonoscopy completed 06/03/2006 which found normal rectum, left-sided diverticula, remaining colonic mucosa normal.  Recommended repeat exam in 10 years.  Today she states she's doing ok overall. She has been having constipation for a couple months. Contracted poison oak and was treated with prenisone and has been constipated since. Overall she is doing well with a high fiber diet including pinto beans, prune juice, and fiber1 cereal. Has a daily bowel movement on this regemin, passes easy, no straining. Denies hematochezia, melena, fever, chills, unintentional weight loss, abdominal pain, N/V. Denies chest pain, dyspnea, dizziness, lightheadedness, syncope, near syncope. Denies any other upper or lower GI symptoms.  Past Medical History:  Diagnosis Date  . Arthritis   . Baker's cyst   . Hypothyroidism   . Osteoarthritis     Past Surgical History:  Procedure Laterality Date  . ABDOMINAL HYSTERECTOMY    . TONSILLECTOMY      Current Outpatient Medications  Medication Sig Dispense Refill  . Calcium Carbonate (CALCIUM 600 PO) Take 1 tablet by mouth 2 (two) times daily.    . cetirizine (ZYRTEC) 10 MG tablet Take 10 mg by mouth daily.    . Coenzyme Q10 (CO Q-10) 100 MG CAPS Take 1 capsule by mouth daily.    Marland Kitchen levothyroxine (SYNTHROID, LEVOTHROID) 50 MCG tablet Take 1 tablet by mouth daily.    . mometasone (NASONEX) 50 MCG/ACT nasal spray Place 1-2 sprays into the nose at bedtime.    . Omega-3 Fatty Acids (FISH OIL) 1200 MG CAPS Take 1 capsule by mouth daily.    . simvastatin (ZOCOR) 40 MG tablet Take 1 tablet by mouth daily.     No current facility-administered  medications for this visit.     Allergies as of 05/28/2017 - Review Complete 05/28/2017  Allergen Reaction Noted  . Penicillins Hives, Itching, and Rash     Family History  Problem Relation Age of Onset  . Colon cancer Neg Hx     Social History   Socioeconomic History  . Marital status: Married    Spouse name: Not on file  . Number of children: Not on file  . Years of education: Not on file  . Highest education level: Not on file  Social Needs  . Financial resource strain: Not on file  . Food insecurity - worry: Not on file  . Food insecurity - inability: Not on file  . Transportation needs - medical: Not on file  . Transportation needs - non-medical: Not on file  Occupational History  . Not on file  Tobacco Use  . Smoking status: Never Smoker  . Smokeless tobacco: Never Used  Substance and Sexual Activity  . Alcohol use: No  . Drug use: No  . Sexual activity: Not on file  Other Topics Concern  . Not on file  Social History Narrative  . Not on file    Review of Systems: General: Negative for anorexia, weight loss, fever, chills, fatigue, weakness. ENT: Negative for hoarseness, difficulty swallowing , nasal congestion. CV: Negative for chest pain, angina, palpitations, dyspnea on exertion, peripheral edema.  Respiratory: Negative for dyspnea at rest, dyspnea on exertion, cough, sputum,  wheezing.  GI: See history of present illness. MS: Negative for joint pain, low back pain.  Derm: Negative for rash or itching.  Endo: Negative for unusual weight change.  Heme: Negative for bruising or bleeding. Allergy: Negative for rash or hives.    Physical Exam: BP (!) 156/84   Pulse 79   Temp 97.6 F (36.4 C) (Oral)   Ht 5\' 3"  (1.6 m)   Wt 181 lb (82.1 kg)   BMI 32.06 kg/m  General:   Alert and oriented. Pleasant and cooperative. Well-nourished and well-developed.  Eyes:  Without icterus, sclera clear and conjunctiva pink.  Ears:  Normal auditory  acuity. Cardiovascular:  S1, S2 present without murmurs appreciated. Extremities without clubbing or edema. Respiratory:  Clear to auscultation bilaterally. No wheezes, rales, or rhonchi. No distress.  Gastrointestinal:  +BS, soft, non-tender and non-distended. No HSM noted. No guarding or rebound. No masses appreciated.  Rectal:  Deferred  Musculoskalatal:  Symmetrical without gross deformities. Neurologic:  Alert and oriented x4;  grossly normal neurologically. Psych:  Alert and cooperative. Normal mood and affect. Heme/Lymph/Immune: No excessive bruising noted.    05/28/2017 9:21 AM   Disclaimer: This note was dictated with voice recognition software. Similar sounding words can inadvertently be transcribed and may not be corrected upon review.

## 2017-05-28 NOTE — Progress Notes (Signed)
cc'ed to pcp °

## 2017-05-28 NOTE — Patient Instructions (Signed)
1. We will schedule your procedure for you. 2. Continue your current bowel regimen that she has been taking. 3. Return for follow-up based on recommendations made after your procedure. 4. Call us if you have any questions or concerns.

## 2017-05-28 NOTE — Assessment & Plan Note (Signed)
Shunt has constipation.  She feels she might have CIC based on an office literature.  Her symptoms are relatively well managed with increased fiber diet including pinto beans, prune juice, and fiber one cereal daily.  Recommend she continue her current bowel regimen as it seems to be working well and causes daily bowel movements without straining or hard stools.  Denies other red flag/warning signs or symptoms such as hematochezia, significant weight loss.  She is also due for colonoscopy at this time.  We will proceed as recommended based on guidelines.  No discovered contraindications to colonoscopy at this time.  Proceed with TCS with Dr. Gala Romney in near future: the risks, benefits, and alternatives have been discussed with the patient in detail. The patient states understanding and desires to proceed.  Patient is not on any anticoagulants, anxiolytics, chronic pain medications, or antidepressants.  Conscious sedation should be adequate for her procedure

## 2017-06-02 ENCOUNTER — Ambulatory Visit: Payer: Medicare Other | Admitting: Gastroenterology

## 2017-06-11 DIAGNOSIS — Z85828 Personal history of other malignant neoplasm of skin: Secondary | ICD-10-CM | POA: Diagnosis not present

## 2017-06-11 DIAGNOSIS — L570 Actinic keratosis: Secondary | ICD-10-CM | POA: Diagnosis not present

## 2017-06-11 DIAGNOSIS — Z08 Encounter for follow-up examination after completed treatment for malignant neoplasm: Secondary | ICD-10-CM | POA: Diagnosis not present

## 2017-06-11 DIAGNOSIS — X32XXXD Exposure to sunlight, subsequent encounter: Secondary | ICD-10-CM | POA: Diagnosis not present

## 2017-07-01 DIAGNOSIS — H34832 Tributary (branch) retinal vein occlusion, left eye, with macular edema: Secondary | ICD-10-CM | POA: Diagnosis not present

## 2017-07-01 DIAGNOSIS — H35352 Cystoid macular degeneration, left eye: Secondary | ICD-10-CM | POA: Diagnosis not present

## 2017-07-01 DIAGNOSIS — H3562 Retinal hemorrhage, left eye: Secondary | ICD-10-CM | POA: Diagnosis not present

## 2017-07-01 DIAGNOSIS — H35042 Retinal micro-aneurysms, unspecified, left eye: Secondary | ICD-10-CM | POA: Diagnosis not present

## 2017-07-04 ENCOUNTER — Encounter (HOSPITAL_COMMUNITY)
Admission: RE | Disposition: A | Discharge status: Home / Self Care | Payer: Self-pay | Source: Ambulatory Visit | Attending: Internal Medicine

## 2017-07-04 ENCOUNTER — Other Ambulatory Visit: Payer: Self-pay

## 2017-07-04 ENCOUNTER — Encounter (HOSPITAL_COMMUNITY): Payer: Self-pay | Admitting: *Deleted

## 2017-07-04 ENCOUNTER — Ambulatory Visit (HOSPITAL_COMMUNITY)
Admission: RE | Admit: 2017-07-04 | Discharge: 2017-07-04 | Disposition: A | Discharge status: Home / Self Care | Payer: Medicare Other | Source: Ambulatory Visit | Attending: Internal Medicine | Admitting: Internal Medicine

## 2017-07-04 DIAGNOSIS — K635 Polyp of colon: Secondary | ICD-10-CM | POA: Insufficient documentation

## 2017-07-04 DIAGNOSIS — K5730 Diverticulosis of large intestine without perforation or abscess without bleeding: Secondary | ICD-10-CM | POA: Insufficient documentation

## 2017-07-04 DIAGNOSIS — M1990 Unspecified osteoarthritis, unspecified site: Secondary | ICD-10-CM | POA: Insufficient documentation

## 2017-07-04 DIAGNOSIS — E039 Hypothyroidism, unspecified: Secondary | ICD-10-CM | POA: Diagnosis not present

## 2017-07-04 DIAGNOSIS — Z880 Allergy status to penicillin: Secondary | ICD-10-CM | POA: Insufficient documentation

## 2017-07-04 DIAGNOSIS — Z1212 Encounter for screening for malignant neoplasm of rectum: Secondary | ICD-10-CM | POA: Diagnosis not present

## 2017-07-04 DIAGNOSIS — Z79899 Other long term (current) drug therapy: Secondary | ICD-10-CM | POA: Insufficient documentation

## 2017-07-04 DIAGNOSIS — Z1211 Encounter for screening for malignant neoplasm of colon: Secondary | ICD-10-CM | POA: Diagnosis not present

## 2017-07-04 HISTORY — PX: COLONOSCOPY: SHX5424

## 2017-07-04 SURGERY — COLONOSCOPY
Anesthesia: Moderate Sedation

## 2017-07-04 MED ORDER — MEPERIDINE HCL 100 MG/ML IJ SOLN
INTRAMUSCULAR | Status: AC
  Filled 2017-07-04: qty 2

## 2017-07-04 MED ORDER — ONDANSETRON HCL 4 MG/2ML IJ SOLN
INTRAMUSCULAR | Status: DC | PRN
  Administered 2017-07-04: 4 mg via INTRAVENOUS

## 2017-07-04 MED ORDER — MEPERIDINE HCL 100 MG/ML IJ SOLN
INTRAMUSCULAR | Status: DC | PRN
  Administered 2017-07-04: 25 mg via INTRAVENOUS
  Administered 2017-07-04: 50 mg via INTRAVENOUS

## 2017-07-04 MED ORDER — MIDAZOLAM HCL 5 MG/5ML IJ SOLN
INTRAMUSCULAR | Status: AC
  Filled 2017-07-04: qty 10

## 2017-07-04 MED ORDER — SODIUM CHLORIDE 0.9 % IV SOLN
INTRAVENOUS | Status: DC
  Administered 2017-07-04: 13:00:00 via INTRAVENOUS

## 2017-07-04 MED ORDER — MIDAZOLAM HCL 5 MG/5ML IJ SOLN
INTRAMUSCULAR | Status: DC | PRN
  Administered 2017-07-04: 2 mg via INTRAVENOUS
  Administered 2017-07-04 (×2): 1 mg via INTRAVENOUS

## 2017-07-04 MED ORDER — ONDANSETRON HCL 4 MG/2ML IJ SOLN
INTRAMUSCULAR | Status: AC
  Filled 2017-07-04: qty 2

## 2017-07-04 NOTE — H&P (Signed)
_0 @   Primary Care Physician:  Sinda Du, MD Primary Gastroenterologist:  Dr. Gala Romney  Pre-Procedure History & Physical: HPI:  Carrie Osborne is a 77 y.o. female is here for a screening colonoscopy.   Past Medical History:  Diagnosis Date  . Arthritis   . Baker's cyst   . Hypothyroidism   . Osteoarthritis     Past Surgical History:  Procedure Laterality Date  . ABDOMINAL HYSTERECTOMY    . TONSILLECTOMY      Prior to Admission medications   Medication Sig Start Date End Date Taking? Authorizing Provider  Calcium Carbonate-Vitamin D (CALCIUM-VITAMIN D3 PO) Take 1 tablet by mouth 2 (two) times daily.   Yes [provider]  cetirizine (ZYRTEC) 10 MG tablet Take 10 mg by mouth daily.   Yes [provider]  Coenzyme Q10 (CO Q-10) 100 MG CAPS Take 100 mg by mouth daily.    Yes [provider]  levothyroxine (SYNTHROID, LEVOTHROID) 50 MCG tablet Take 50 mcg by mouth daily.  03/03/17  Yes [provider]  mometasone (NASONEX) 50 MCG/ACT nasal spray Place 1-2 sprays into the nose daily as needed (for allergies).    Yes [provider]  Multiple Vitamins-Minerals (CENTRUM SILVER PO) Take 1 tablet by mouth daily.   Yes [provider]  Na Sulfate-K Sulfate-Mg Sulf 17.5-3.13-1.6 GM/177ML SOLN Take 1 kit by mouth as directed. 05/28/17  Yes Robbin Loughmiller, Cristopher Estimable, MD  Omega-3 Fatty Acids (FISH OIL) 1200 MG CAPS Take 1,200 mg by mouth daily.    Yes [provider]  simvastatin (ZOCOR) 40 MG tablet Take 40 mg by mouth at bedtime.  04/25/17  Yes [provider]    Allergies as of 05/28/2017 - Review Complete 05/28/2017  Allergen Reaction Noted  . Penicillins Hives, Itching, and Rash     Family History  Problem Relation Age of Onset  . Colon cancer Neg Hx     Social History   Socioeconomic History  . Marital status: Married    Spouse name: Not on file  . Number of children: Not on file  . Years of education: Not  on file  . Highest education level: Not on file  Social Needs  . Financial resource strain: Not on file  . Food insecurity - worry: Not on file  . Food insecurity - inability: Not on file  . Transportation needs - medical: Not on file  . Transportation needs - non-medical: Not on file  Occupational History  . Not on file  Tobacco Use  . Smoking status: Never Smoker  . Smokeless tobacco: Never Used  Substance and Sexual Activity  . Alcohol use: No  . Drug use: No  . Sexual activity: Not on file  Other Topics Concern  . Not on file  Social History Narrative  . Not on file    Review of Systems: See HPI, otherwise negative ROS  Physical Exam: BP (!) 143/84   Pulse 98   Temp 98 F (36.7 C) (Oral)   Resp 12   Ht _1  (1.651 m)   Wt 181 lb (82.1 kg)   SpO2 96%   BMI 30.12 kg/m  General:   Alert,  Well-developed, well-nourished, pleasant and cooperative in NAD Lungs:  Clear throughout to auscultation.   No wheezes, crackles, or rhonchi. No acute distress. Heart:  Regular rate and rhythm; no murmurs, clicks, rubs,  or gallops. Abdomen:  Soft, nontender and nondistended. No masses, hepatosplenomegaly or hernias noted. Normal bowel sounds, without  guarding, and without rebound.    Impression/Plan: Carrie Osborne is now here to undergo a screening colonoscopy.  I have offered the patient a screening colonoscopy today. Risks, benefits, limitations, imponderables and alternatives regarding colonoscopy have been reviewed with the patient. Questions have been answered. All parties agreeable.     Notice:  This dictation was prepared with Dragon dictation along with smaller phrase technology. Any transcriptional errors that result from this process are unintentional and may not be corrected upon review.

## 2017-07-04 NOTE — Op Note (Signed)
[facility] Patient Name: Carrie Osborne Procedure Date: 07/04/2017 12:46 PM MRN: [id number] Date of Birth: 1940/08/11 Attending MD: Norvel Richards , MD CSN: [id number] Age: 77 Admit Type: Outpatient Procedure:                Colonoscopy Indications:              Screening for colorectal malignant neoplasm Providers:                Norvel Richards, MD, Lurline Del, RN, Aram Candela Referring MD:              Medicines:                Midazolam 4 mg IV, Meperidine 75 mg IV, Ondansetron                            4 mg IV Complications:            No immediate complications. Estimated Blood Loss:     Estimated blood loss was minimal. Procedure:                Pre-Anesthesia Assessment:                           - Prior to the procedure, a History and Physical                            was performed, and patient medications and                            allergies were reviewed. The patient's tolerance of                            previous anesthesia was also reviewed. The risks                            and benefits of the procedure and the sedation                            options and risks were discussed with the patient.                            All questions were answered, and informed consent                            was obtained. Prior Anticoagulants: The patient has                            taken no previous anticoagulant or antiplatelet                            agents. ASA Grade Assessment: III - A patient with  severe systemic disease. After reviewing the risks                            and benefits, the patient was deemed in                            satisfactory condition to undergo the procedure.                           After obtaining informed consent, the colonoscope                            was passed under direct vision. Throughout the                            procedure, the  patient's blood pressure, pulse, and                            oxygen saturations were monitored continuously. The                            EC-[id number] ([id number]) scope was introduced through                            the anus and advanced to the the cecum, identified                            by appendiceal orifice and ileocecal valve. The                            ileocecal valve, appendiceal orifice, and rectum                            were photographed. The quality of the bowel                            preparation was adequate. Scope In: 2:09:40 PM Scope Out: 2:25:25 PM Scope Withdrawal Time: 0 hours 11 minutes 27 seconds  Total Procedure Duration: 0 hours 15 minutes 45 seconds  Findings:      The perianal and digital rectal examinations were normal.      Scattered small and large-mouthed diverticula were found in the sigmoid       colon and descending colon.      A 5 mm polyp was found in the descending colon. The polyp was sessile.       The polyp was removed with a cold snare. Resection and retrieval were       complete. Estimated blood loss was minimal.      The exam was otherwise without abnormality on direct and retroflexion       views. Impression:               - Diverticulosis in the sigmoid colon and in the                            descending colon.                           -  One 5 mm polyp in the descending colon, removed                            with a cold snare. Resected and retrieved.                           - The examination was otherwise normal on direct                            and retroflexion views. Moderate Sedation:      Moderate (conscious) sedation was administered by the endoscopy nurse       and supervised by the endoscopist. The following parameters were       monitored: oxygen saturation, heart rate, blood pressure, respiratory       rate, EKG, adequacy of pulmonary ventilation, and response to care.       Total physician intraservice  time was 26 minutes. Recommendation:           - Patient has a contact number available for                            emergencies. The signs and symptoms of potential                            delayed complications were discussed with the                            patient. Return to normal activities tomorrow.                            Written discharge instructions were provided to the                            patient.                           - Resume previous diet.                           - Continue present medications.                           - No repeat colonoscopy due to age. follow up on                            pathology.                           - Return to GI clinic PRN. Procedure Code(s):        --- Professional ---                           [phone], Colonoscopy, flexible; with removal of                            tumor(s), polyp(s), or other lesion(s) by snare  technique                           [phone], Moderate sedation services provided by the                            same physician or other qualified health care                            professional performing the diagnostic or                            therapeutic service that the sedation supports,                            requiring the presence of an independent trained                            observer to assist in the monitoring of the                            patient's level of consciousness and physiological                            status; initial 15 minutes of intraservice time,                            patient age 38 years or older                           [phone], Moderate sedation services; each additional                            15 minutes intraservice time Diagnosis Code(s):        --- Professional ---                           Z12.11, Encounter for screening for malignant                            neoplasm of colon                           D12.4,  Benign neoplasm of descending colon                           K57.30, Diverticulosis of large intestine without                            perforation or abscess without bleeding CPT copyright 2016 [employer]. All rights reserved. The codes documented in this report are preliminary and upon coder review may  be revised to meet current compliance requirements. Cristopher Estimable. Camren Henthorn, MD Norvel Richards, MD 07/04/2017 2:33:39 PM This report has been signed electronically. Number of Addenda: 0

## 2017-07-04 NOTE — Discharge Instructions (Signed)
Diverticulosis Diverticulosis is a condition that develops when small pouches (diverticula) form in the wall of the large intestine (colon). The colon is where water is absorbed and stool is formed. The pouches form when the inside layer of the colon pushes through weak spots in the outer layers of the colon. You may have a few pouches or many of them. What are the causes? The cause of this condition is not known. What increases the risk? The following factors may make you more likely to develop this condition: Being older than age 41. Your risk for this condition increases with age. Diverticulosis is rare among people younger than age 19. By age 82, many people have it. Eating a low-fiber diet. Having frequent constipation. Being overweight. Not getting enough exercise. Smoking. Taking over-the-counter pain medicines, like aspirin and ibuprofen. Having a family history of diverticulosis.  What are the signs or symptoms? In most people, there are no symptoms of this condition. If you do have symptoms, they may include: Bloating. Cramps in the abdomen. Constipation or diarrhea. Pain in the lower left side of the abdomen.  How is this diagnosed? This condition is most often diagnosed during an exam for other colon problems. Because diverticulosis usually has no symptoms, it often cannot be diagnosed independently. This condition may be diagnosed by: Using a flexible scope to examine the colon (colonoscopy). Taking an X-ray of the colon after dye has been put into the colon (barium enema). Doing a CT scan.  How is this treated? You may not need treatment for this condition if you have never developed an infection related to diverticulosis. If you have had an infection before, treatment may include: Eating a high-fiber diet. This may include eating more fruits, vegetables, and grains. Taking a fiber supplement. Taking a live bacteria supplement (probiotic). Taking medicine to relax your  colon. Taking antibiotic medicines.  Follow these instructions at home: Drink 6-8 glasses of water or more each day to prevent constipation. Try not to strain when you have a bowel movement. If you have had an infection before: Eat more fiber as directed by your health care provider or your diet and nutrition specialist (dietitian). Take a fiber supplement or probiotic, if your health care provider approves. Take over-the-counter and prescription medicines only as told by your health care provider. If you were prescribed an antibiotic, take it as told by your health care provider. Do not stop taking the antibiotic even if you start to feel better. Keep all follow-up visits as told by your health care provider. This is important. Contact a health care provider if: You have pain in your abdomen. You have bloating. You have cramps. You have not had a bowel movement in 3 days. Get help right away if: Your pain gets worse. Your bloating becomes very bad. You have a fever or chills, and your symptoms suddenly get worse. You vomit. You have bowel movements that are bloody or black. You have bleeding from your rectum. Summary Diverticulosis is a condition that develops when small pouches (diverticula) form in the wall of the large intestine (colon). You may have a few pouches or many of them. This condition is most often diagnosed during an exam for other colon problems. If you have had an infection related to diverticulosis, treatment may include increasing the fiber in your diet, taking supplements, or taking medicines. This information is not intended to replace advice given to you by your health care provider. Make sure you discuss any questions you have  with your health care provider. Document Released: 03/07/2004 Document Revised: 04/29/2016 Document Reviewed: 04/29/2016 Elsevier Interactive Patient Education  2017 [place].  Colonoscopy Discharge Instructions  Read the  instructions outlined below and refer to this sheet in the next few weeks. These discharge instructions provide you with general information on caring for yourself after you leave the hospital. Your doctor may also give you specific instructions. While your treatment has been planned according to the most current medical practices available, unavoidable complications occasionally occur. If you have any problems or questions after discharge, call Dr. Gala Romney at [phone]. ACTIVITY  You may resume your regular activity, but move at a slower pace for the next 24 hours.   Take frequent rest periods for the next 24 hours.   Walking will help get rid of the air and reduce the bloated feeling in your belly (abdomen).   No driving for 24 hours (because of the medicine (anesthesia) used during the test).    Do not sign any important legal documents or operate any machinery for 24 hours (because of the anesthesia used during the test).  NUTRITION  Drink plenty of fluids.   You may resume your normal diet as instructed by your doctor.   Begin with a light meal and progress to your normal diet. Heavy or fried foods are harder to digest and may make you feel sick to your stomach (nauseated).   Avoid alcoholic beverages for 24 hours or as instructed.  MEDICATIONS  You may resume your normal medications unless your doctor tells you otherwise.  WHAT YOU CAN EXPECT TODAY  Some feelings of bloating in the abdomen.   Passage of more gas than usual.   Spotting of blood in your stool or on the toilet paper.  IF YOU HAD POLYPS REMOVED DURING THE COLONOSCOPY:  No aspirin products for 7 days or as instructed.   No alcohol for 7 days or as instructed.   Eat a soft diet for the next 24 hours.  FINDING OUT THE RESULTS OF YOUR TEST Not all test results are available during your visit. If your test results are not back during the visit, make an appointment with your caregiver to find out the results. Do not  assume everything is normal if you have not heard from your caregiver or the medical facility. It is important for you to follow up on all of your test results.  SEEK IMMEDIATE MEDICAL ATTENTION IF:  You have more than a spotting of blood in your stool.   Your belly is swollen (abdominal distention).   You are nauseated or vomiting.   You have a temperature over 101.   You have abdominal pain or discomfort that is severe or gets worse throughout the day.    Diverticulosis and colon polyp information provided  Further recommendations to follow pending review of pathology report

## 2017-07-08 ENCOUNTER — Encounter (HOSPITAL_COMMUNITY): Payer: Self-pay | Admitting: Internal Medicine

## 2017-07-15 ENCOUNTER — Encounter: Payer: Self-pay | Admitting: Internal Medicine

## 2017-08-26 DIAGNOSIS — H35042 Retinal micro-aneurysms, unspecified, left eye: Secondary | ICD-10-CM | POA: Diagnosis not present

## 2017-08-26 DIAGNOSIS — H348321 Tributary (branch) retinal vein occlusion, left eye, with retinal neovascularization: Secondary | ICD-10-CM | POA: Diagnosis not present

## 2017-08-26 DIAGNOSIS — H3562 Retinal hemorrhage, left eye: Secondary | ICD-10-CM | POA: Diagnosis not present

## 2017-08-26 DIAGNOSIS — H35352 Cystoid macular degeneration, left eye: Secondary | ICD-10-CM | POA: Diagnosis not present

## 2017-09-19 IMAGING — MR MR KNEE*R* W/O CM
4 of 6 series · 13 of 40 positions shown · non-contrast
Comparison: None.

CLINICAL DATA: Right knee pain status post fall 1 week ago.

EXAM:
MRI OF THE RIGHT KNEE WITHOUT CONTRAST
TECHNIQUE: Multiplanar, multisequence MR imaging of the knee was performed. No
intravenous contrast was administered.

[Series 3: pdfs axial · axial · 3.0mm · 0.20mm/px · z∈[-30,+42]mm · 3 of 30 slices shown]
[im 5/30]
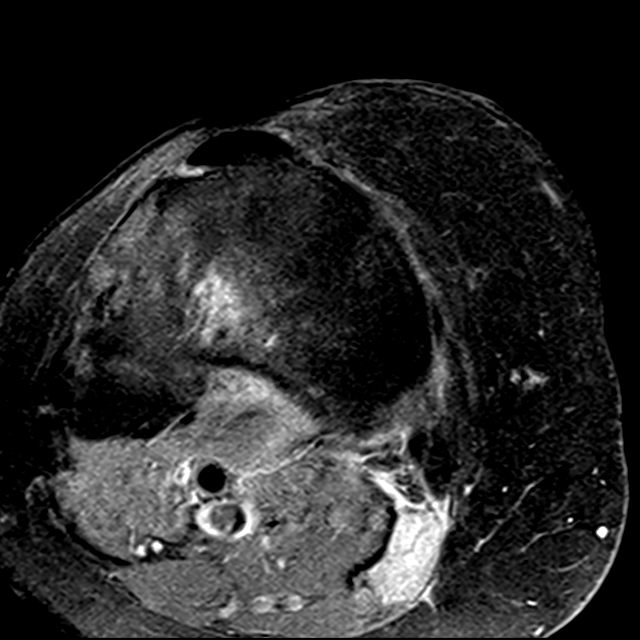
[im 15/30]
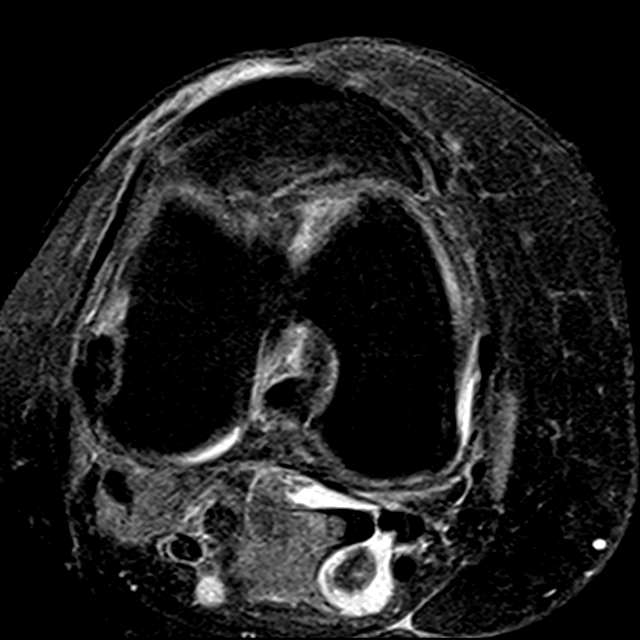
[im 25/30]
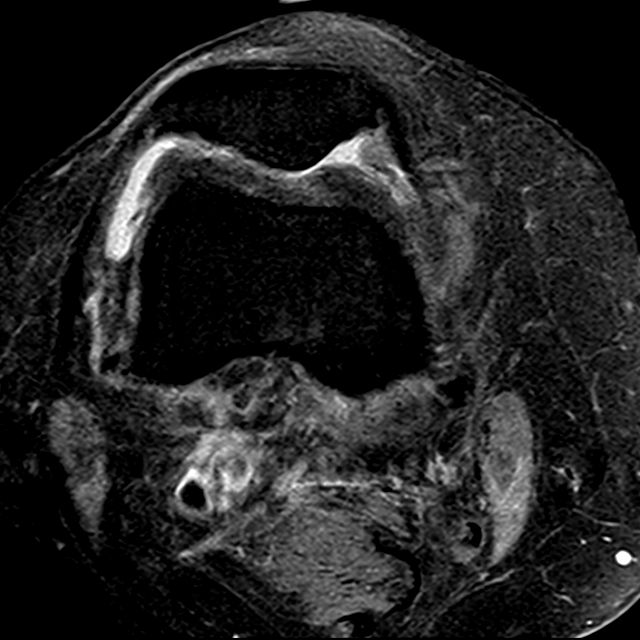

[Series 4: T1 · coronal · 3.0mm · 0.20mm/px · 4 of 34 slices shown]
[im 1/34]
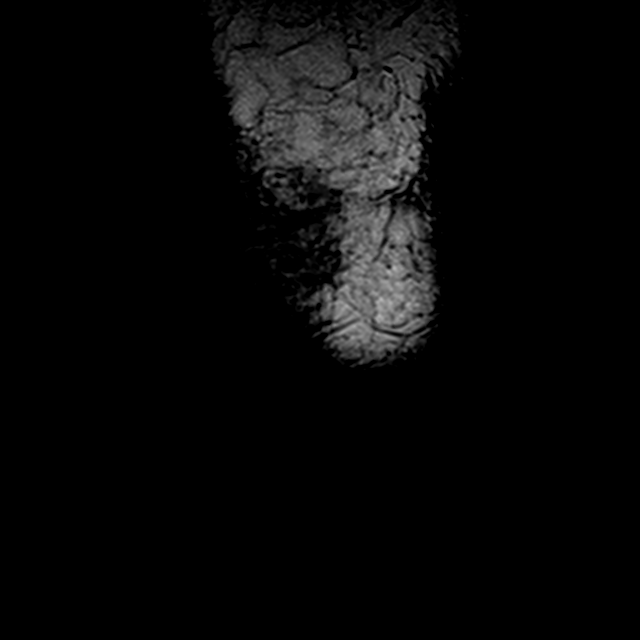
[im 5/34]
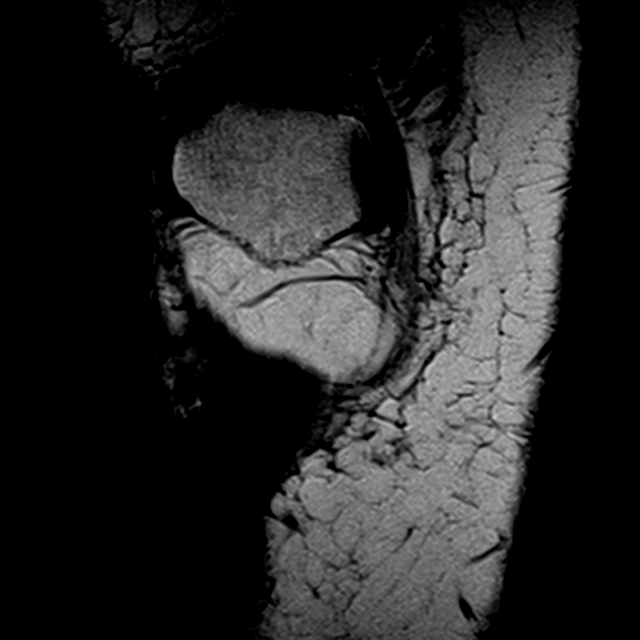
[im 19/34]
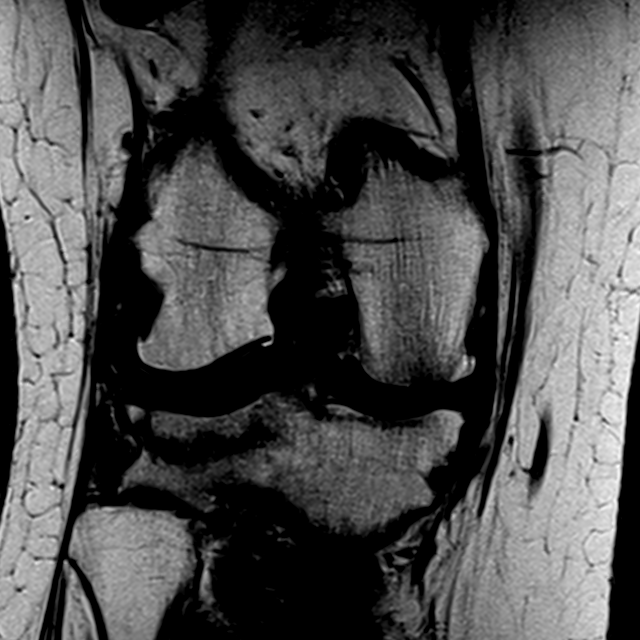
[im 29/34]
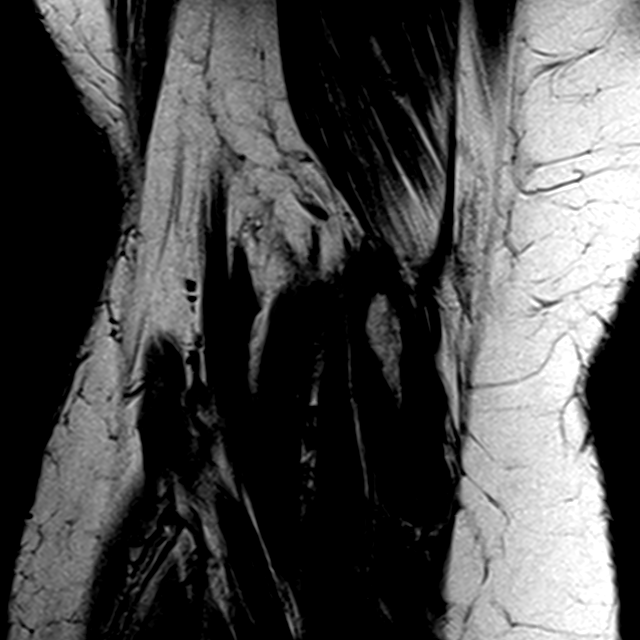

[Series 5: pdfs sag · sagittal · 3.0mm · 0.21mm/px · 3 of 29 slices shown]
[im 5/29]
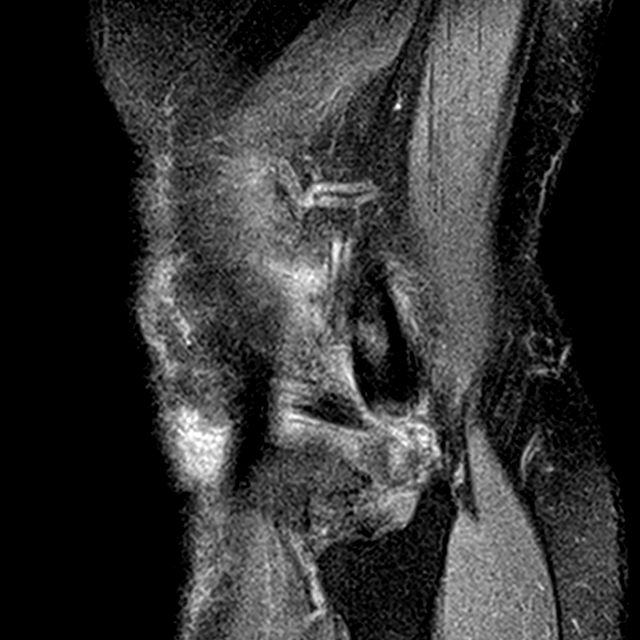
[im 15/29]
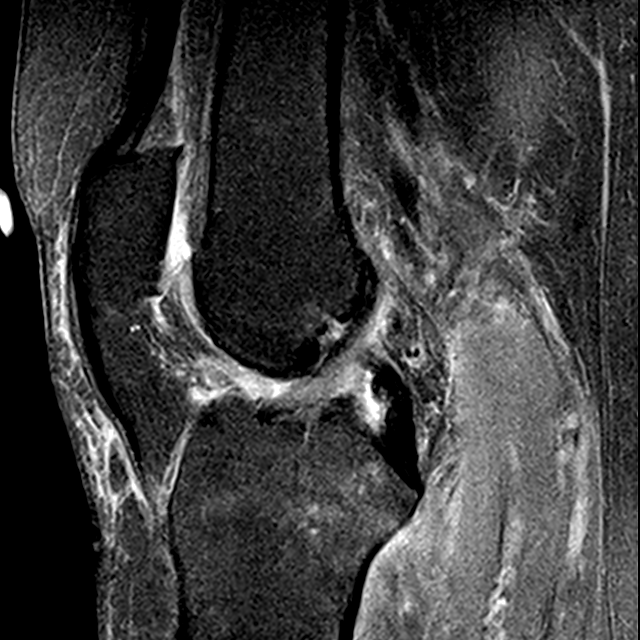
[im 24/29]
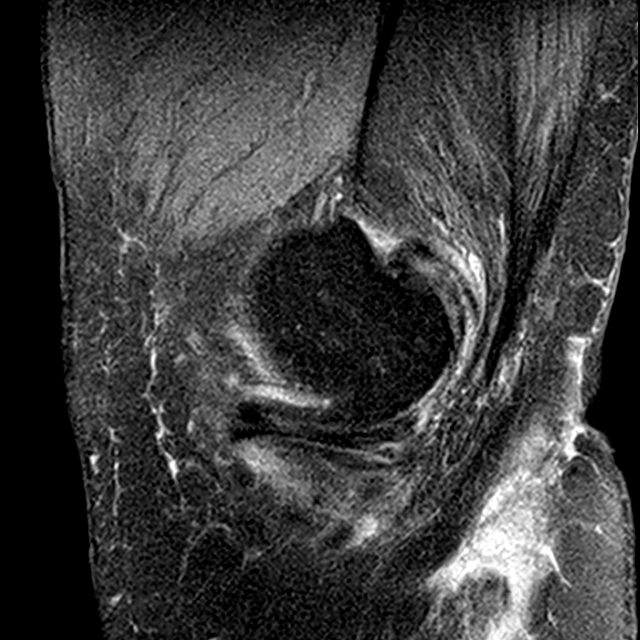

[Series 6: t2fs cor · coronal · 3.0mm · 0.21mm/px · 3 of 24 slices shown]
[im 5/24]
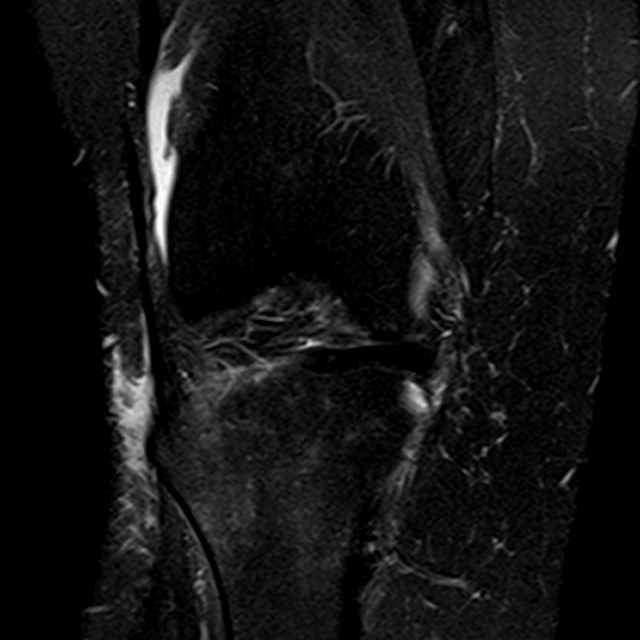
[im 14/24]
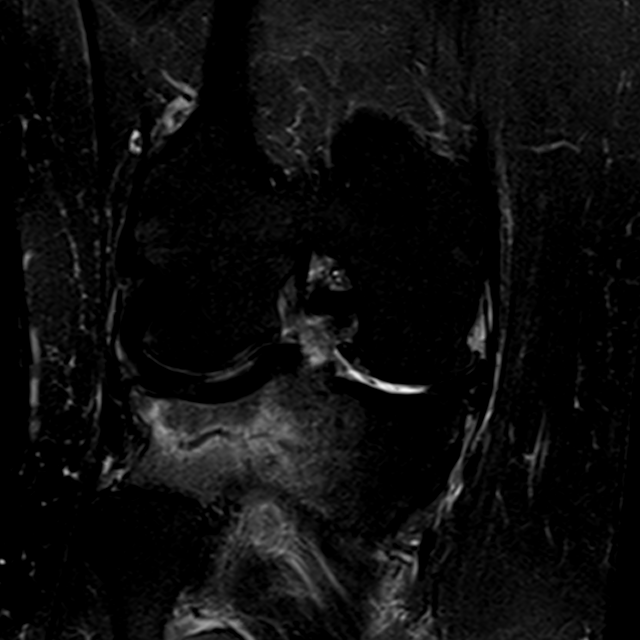
[im 24/24]
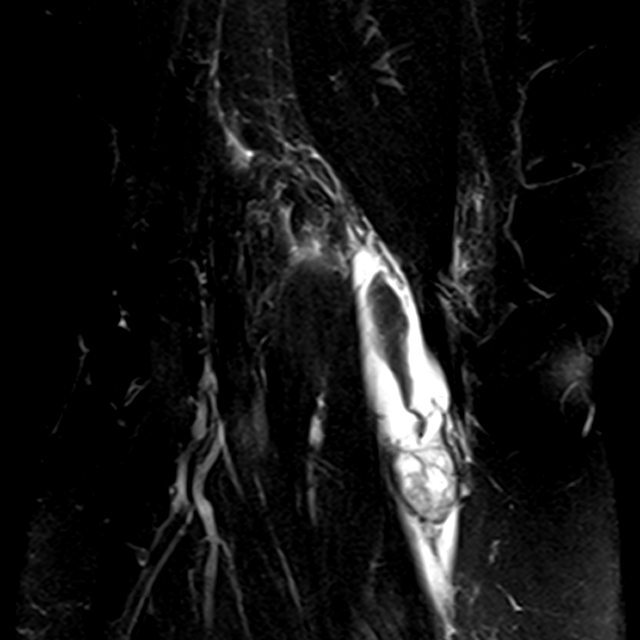

[13 of 40 positions shown; findings below may reference images not displayed]

FINDINGS: MENISCI

Medial meniscus: Radial tear of the posterior horn of the medial
meniscus with peripheral meniscal extrusion.

Lateral meniscus: Oblique tear of the posterior horn of the lateral
meniscus extending to superior articular surface.

LIGAMENTS

Cruciates:  Intact ACL and PCL.

Collaterals: Medial collateral ligament is intact. Lateral
collateral ligament complex is intact.

CARTILAGE

Patellofemoral: High-grade partial-thickness cartilage loss with
areas of full-thickness cartilage loss of the medial and lateral
patellar facets.

Medial: High-grade partial-thickness cartilage loss of the medial
femoral condyle and medial tibial plateau.

Lateral:  No focal chondral defect.

Joint: No significant joint effusion. Normal Hoffa's fat. No plical
thickening.

Popliteal Fossa: Moderate Baker cyst. 10 mm loose body within the
Baker cyst. Intact popliteus tendon.

Extensor Mechanism:  Intact.

Bones: Marrow edema in the posterior lateral tibial plateau with a
transverse linear signal abnormality most consistent with a
nondisplaced fracture which does not appear to involve the articular
surface.
IMPRESSION: 1. Nondisplaced transverse fracture of the lateral tibial plateau
with surrounding marrow edema. No articular surface involvement.
2. Radial tear of the posterior horn of the medial meniscus with
peripheral meniscal extrusion.
3. Oblique tear of the posterior horn of the lateral meniscus
extending to superior articular surface.
4. Cartilage abnormalities of the patellofemoral compartment and
medial femorotibial compartment as described above.

## 2017-09-23 DIAGNOSIS — H2513 Age-related nuclear cataract, bilateral: Secondary | ICD-10-CM | POA: Diagnosis not present

## 2017-09-23 DIAGNOSIS — H35042 Retinal micro-aneurysms, unspecified, left eye: Secondary | ICD-10-CM | POA: Diagnosis not present

## 2017-09-23 DIAGNOSIS — H34832 Tributary (branch) retinal vein occlusion, left eye, with macular edema: Secondary | ICD-10-CM | POA: Diagnosis not present

## 2017-09-23 DIAGNOSIS — H35352 Cystoid macular degeneration, left eye: Secondary | ICD-10-CM | POA: Diagnosis not present

## 2017-10-23 DIAGNOSIS — M545 Low back pain: Secondary | ICD-10-CM | POA: Diagnosis not present

## 2017-10-23 DIAGNOSIS — E039 Hypothyroidism, unspecified: Secondary | ICD-10-CM | POA: Diagnosis not present

## 2017-10-23 DIAGNOSIS — E785 Hyperlipidemia, unspecified: Secondary | ICD-10-CM | POA: Diagnosis not present

## 2017-10-23 DIAGNOSIS — E669 Obesity, unspecified: Secondary | ICD-10-CM | POA: Diagnosis not present

## 2017-10-23 DIAGNOSIS — M179 Osteoarthritis of knee, unspecified: Secondary | ICD-10-CM | POA: Diagnosis not present

## 2017-10-29 DIAGNOSIS — M545 Low back pain: Secondary | ICD-10-CM | POA: Diagnosis not present

## 2017-10-29 DIAGNOSIS — E039 Hypothyroidism, unspecified: Secondary | ICD-10-CM | POA: Diagnosis not present

## 2017-10-29 DIAGNOSIS — J301 Allergic rhinitis due to pollen: Secondary | ICD-10-CM | POA: Diagnosis not present

## 2017-10-29 DIAGNOSIS — E785 Hyperlipidemia, unspecified: Secondary | ICD-10-CM | POA: Diagnosis not present

## 2017-11-11 DIAGNOSIS — H3562 Retinal hemorrhage, left eye: Secondary | ICD-10-CM | POA: Diagnosis not present

## 2017-11-11 DIAGNOSIS — H34832 Tributary (branch) retinal vein occlusion, left eye, with macular edema: Secondary | ICD-10-CM | POA: Diagnosis not present

## 2017-11-11 DIAGNOSIS — H35352 Cystoid macular degeneration, left eye: Secondary | ICD-10-CM | POA: Diagnosis not present

## 2017-11-11 DIAGNOSIS — H35042 Retinal micro-aneurysms, unspecified, left eye: Secondary | ICD-10-CM | POA: Diagnosis not present

## 2017-11-19 DIAGNOSIS — H7292 Unspecified perforation of tympanic membrane, left ear: Secondary | ICD-10-CM | POA: Diagnosis not present

## 2017-11-19 DIAGNOSIS — H9212 Otorrhea, left ear: Secondary | ICD-10-CM | POA: Diagnosis not present

## 2017-11-19 DIAGNOSIS — H6123 Impacted cerumen, bilateral: Secondary | ICD-10-CM | POA: Diagnosis not present

## 2017-12-16 DIAGNOSIS — I10 Essential (primary) hypertension: Secondary | ICD-10-CM | POA: Diagnosis not present

## 2017-12-16 DIAGNOSIS — H7292 Unspecified perforation of tympanic membrane, left ear: Secondary | ICD-10-CM | POA: Diagnosis not present

## 2017-12-16 DIAGNOSIS — J310 Chronic rhinitis: Secondary | ICD-10-CM | POA: Diagnosis not present

## 2017-12-16 DIAGNOSIS — H9222 Otorrhagia, left ear: Secondary | ICD-10-CM | POA: Diagnosis not present

## 2017-12-26 DIAGNOSIS — I10 Essential (primary) hypertension: Secondary | ICD-10-CM | POA: Diagnosis not present

## 2018-01-06 DIAGNOSIS — H35042 Retinal micro-aneurysms, unspecified, left eye: Secondary | ICD-10-CM | POA: Diagnosis not present

## 2018-01-06 DIAGNOSIS — H34832 Tributary (branch) retinal vein occlusion, left eye, with macular edema: Secondary | ICD-10-CM | POA: Diagnosis not present

## 2018-01-06 DIAGNOSIS — H35352 Cystoid macular degeneration, left eye: Secondary | ICD-10-CM | POA: Diagnosis not present

## 2018-01-06 DIAGNOSIS — H3562 Retinal hemorrhage, left eye: Secondary | ICD-10-CM | POA: Diagnosis not present

## 2018-01-09 DIAGNOSIS — I10 Essential (primary) hypertension: Secondary | ICD-10-CM | POA: Diagnosis not present

## 2018-01-26 DIAGNOSIS — I10 Essential (primary) hypertension: Secondary | ICD-10-CM | POA: Diagnosis not present

## 2018-03-10 ENCOUNTER — Other Ambulatory Visit (HOSPITAL_COMMUNITY): Payer: Self-pay | Admitting: Pulmonary Disease

## 2018-03-10 DIAGNOSIS — Z1231 Encounter for screening mammogram for malignant neoplasm of breast: Secondary | ICD-10-CM

## 2018-03-16 DIAGNOSIS — H348312 Tributary (branch) retinal vein occlusion, right eye, stable: Secondary | ICD-10-CM | POA: Diagnosis not present

## 2018-03-16 DIAGNOSIS — I10 Essential (primary) hypertension: Secondary | ICD-10-CM | POA: Diagnosis not present

## 2018-03-16 DIAGNOSIS — H35352 Cystoid macular degeneration, left eye: Secondary | ICD-10-CM | POA: Diagnosis not present

## 2018-03-16 DIAGNOSIS — H3561 Retinal hemorrhage, right eye: Secondary | ICD-10-CM | POA: Diagnosis not present

## 2018-03-16 DIAGNOSIS — Z23 Encounter for immunization: Secondary | ICD-10-CM | POA: Diagnosis not present

## 2018-03-16 DIAGNOSIS — H34832 Tributary (branch) retinal vein occlusion, left eye, with macular edema: Secondary | ICD-10-CM | POA: Diagnosis not present

## 2018-03-25 DIAGNOSIS — L258 Unspecified contact dermatitis due to other agents: Secondary | ICD-10-CM | POA: Diagnosis not present

## 2018-03-26 DIAGNOSIS — I10 Essential (primary) hypertension: Secondary | ICD-10-CM | POA: Diagnosis not present

## 2018-04-06 DIAGNOSIS — H348312 Tributary (branch) retinal vein occlusion, right eye, stable: Secondary | ICD-10-CM | POA: Diagnosis not present

## 2018-04-06 DIAGNOSIS — H34832 Tributary (branch) retinal vein occlusion, left eye, with macular edema: Secondary | ICD-10-CM | POA: Diagnosis not present

## 2018-04-06 DIAGNOSIS — H3561 Retinal hemorrhage, right eye: Secondary | ICD-10-CM | POA: Diagnosis not present

## 2018-04-06 DIAGNOSIS — I10 Essential (primary) hypertension: Secondary | ICD-10-CM | POA: Diagnosis not present

## 2018-04-06 DIAGNOSIS — H35352 Cystoid macular degeneration, left eye: Secondary | ICD-10-CM | POA: Diagnosis not present

## 2018-04-20 ENCOUNTER — Ambulatory Visit (HOSPITAL_COMMUNITY): Payer: Medicare Other

## 2018-04-29 ENCOUNTER — Ambulatory Visit (HOSPITAL_COMMUNITY)
Admission: RE | Admit: 2018-04-29 | Discharge: 2018-04-29 | Disposition: A | Discharge status: Home / Self Care | Payer: Medicare Other | Source: Ambulatory Visit | Attending: Pulmonary Disease | Admitting: Pulmonary Disease

## 2018-04-29 ENCOUNTER — Encounter (HOSPITAL_COMMUNITY): Payer: Self-pay

## 2018-04-29 DIAGNOSIS — Z1231 Encounter for screening mammogram for malignant neoplasm of breast: Secondary | ICD-10-CM | POA: Diagnosis not present

## 2018-06-03 DIAGNOSIS — E669 Obesity, unspecified: Secondary | ICD-10-CM | POA: Diagnosis not present

## 2018-06-03 DIAGNOSIS — E039 Hypothyroidism, unspecified: Secondary | ICD-10-CM | POA: Diagnosis not present

## 2018-06-03 DIAGNOSIS — M179 Osteoarthritis of knee, unspecified: Secondary | ICD-10-CM | POA: Diagnosis not present

## 2018-06-03 DIAGNOSIS — R194 Change in bowel habit: Secondary | ICD-10-CM | POA: Diagnosis not present

## 2018-06-03 DIAGNOSIS — R739 Hyperglycemia, unspecified: Secondary | ICD-10-CM | POA: Diagnosis not present

## 2018-06-03 DIAGNOSIS — M545 Low back pain: Secondary | ICD-10-CM | POA: Diagnosis not present

## 2018-06-03 DIAGNOSIS — E785 Hyperlipidemia, unspecified: Secondary | ICD-10-CM | POA: Diagnosis not present

## 2018-06-23 DIAGNOSIS — Z0000 Encounter for general adult medical examination without abnormal findings: Secondary | ICD-10-CM | POA: Diagnosis not present

## 2018-07-02 DIAGNOSIS — H348312 Tributary (branch) retinal vein occlusion, right eye, stable: Secondary | ICD-10-CM | POA: Diagnosis not present

## 2018-07-02 DIAGNOSIS — E039 Hypothyroidism, unspecified: Secondary | ICD-10-CM | POA: Diagnosis not present

## 2018-07-02 DIAGNOSIS — H34832 Tributary (branch) retinal vein occlusion, left eye, with macular edema: Secondary | ICD-10-CM | POA: Diagnosis not present

## 2018-07-02 DIAGNOSIS — I10 Essential (primary) hypertension: Secondary | ICD-10-CM | POA: Diagnosis not present

## 2018-07-02 DIAGNOSIS — E669 Obesity, unspecified: Secondary | ICD-10-CM | POA: Diagnosis not present

## 2018-07-02 DIAGNOSIS — H35352 Cystoid macular degeneration, left eye: Secondary | ICD-10-CM | POA: Diagnosis not present

## 2018-07-02 DIAGNOSIS — J019 Acute sinusitis, unspecified: Secondary | ICD-10-CM | POA: Diagnosis not present

## 2018-07-02 DIAGNOSIS — H3561 Retinal hemorrhage, right eye: Secondary | ICD-10-CM | POA: Diagnosis not present

## 2018-07-08 DIAGNOSIS — H9222 Otorrhagia, left ear: Secondary | ICD-10-CM | POA: Diagnosis not present

## 2018-07-08 DIAGNOSIS — J0141 Acute recurrent pansinusitis: Secondary | ICD-10-CM | POA: Diagnosis not present

## 2018-07-08 DIAGNOSIS — J310 Chronic rhinitis: Secondary | ICD-10-CM | POA: Diagnosis not present

## 2018-07-08 DIAGNOSIS — H7292 Unspecified perforation of tympanic membrane, left ear: Secondary | ICD-10-CM | POA: Diagnosis not present

## 2018-08-24 DIAGNOSIS — J019 Acute sinusitis, unspecified: Secondary | ICD-10-CM | POA: Diagnosis not present

## 2018-08-24 DIAGNOSIS — H722X2 Other marginal perforations of tympanic membrane, left ear: Secondary | ICD-10-CM | POA: Diagnosis not present

## 2018-12-21 DIAGNOSIS — E785 Hyperlipidemia, unspecified: Secondary | ICD-10-CM | POA: Diagnosis not present

## 2018-12-21 DIAGNOSIS — M545 Low back pain: Secondary | ICD-10-CM | POA: Diagnosis not present

## 2018-12-21 DIAGNOSIS — M179 Osteoarthritis of knee, unspecified: Secondary | ICD-10-CM | POA: Diagnosis not present

## 2018-12-21 DIAGNOSIS — E669 Obesity, unspecified: Secondary | ICD-10-CM | POA: Diagnosis not present

## 2018-12-21 DIAGNOSIS — I10 Essential (primary) hypertension: Secondary | ICD-10-CM | POA: Diagnosis not present

## 2018-12-21 DIAGNOSIS — E039 Hypothyroidism, unspecified: Secondary | ICD-10-CM | POA: Diagnosis not present

## 2018-12-21 LAB — LIPID PANEL
Cholesterol: 168 (ref 0–200)
HDL: 46 (ref 35–70)
LDL Cholesterol: 93
Triglycerides: 194 — AB (ref 40–160)

## 2018-12-21 LAB — CBC AND DIFFERENTIAL
HCT: 47 — AB (ref 36–46)
Hemoglobin: 15.6 (ref 12.0–16.0)
Platelets: 350 (ref 150–399)
WBC: 6.3

## 2018-12-21 LAB — HEPATIC FUNCTION PANEL: Bilirubin, Total: 1.3

## 2018-12-21 LAB — BASIC METABOLIC PANEL
BUN: 12 (ref 4–21)
Creatinine: 0.8 (ref <=1.1)
Glucose: 113
Potassium: 4.4 (ref 3.4–5.3)
Sodium: 140 (ref 137–147)

## 2018-12-23 DIAGNOSIS — E785 Hyperlipidemia, unspecified: Secondary | ICD-10-CM | POA: Diagnosis not present

## 2018-12-23 DIAGNOSIS — M179 Osteoarthritis of knee, unspecified: Secondary | ICD-10-CM | POA: Diagnosis not present

## 2018-12-23 DIAGNOSIS — I10 Essential (primary) hypertension: Secondary | ICD-10-CM | POA: Diagnosis not present

## 2018-12-23 DIAGNOSIS — E039 Hypothyroidism, unspecified: Secondary | ICD-10-CM | POA: Diagnosis not present

## 2019-01-21 DIAGNOSIS — H35042 Retinal micro-aneurysms, unspecified, left eye: Secondary | ICD-10-CM | POA: Diagnosis not present

## 2019-01-21 DIAGNOSIS — H34832 Tributary (branch) retinal vein occlusion, left eye, with macular edema: Secondary | ICD-10-CM | POA: Diagnosis not present

## 2019-01-21 DIAGNOSIS — H348312 Tributary (branch) retinal vein occlusion, right eye, stable: Secondary | ICD-10-CM | POA: Diagnosis not present

## 2019-01-21 DIAGNOSIS — H35352 Cystoid macular degeneration, left eye: Secondary | ICD-10-CM | POA: Diagnosis not present

## 2019-01-21 DIAGNOSIS — H3561 Retinal hemorrhage, right eye: Secondary | ICD-10-CM | POA: Diagnosis not present

## 2019-01-21 LAB — HM DIABETES EYE EXAM

## 2019-01-22 ENCOUNTER — Other Ambulatory Visit: Payer: Self-pay

## 2019-03-02 DIAGNOSIS — Z23 Encounter for immunization: Secondary | ICD-10-CM | POA: Diagnosis not present

## 2019-03-24 ENCOUNTER — Other Ambulatory Visit: Payer: Self-pay

## 2019-03-24 ENCOUNTER — Ambulatory Visit (INDEPENDENT_AMBULATORY_CARE_PROVIDER_SITE_OTHER): Payer: Medicare Other | Admitting: Family Medicine

## 2019-03-24 ENCOUNTER — Telehealth: Payer: Self-pay | Admitting: Family Medicine

## 2019-03-24 VITALS — BP 153/85 | HR 74 | Temp 98.1°F | Ht 63.0 in | Wt 176.6 lb

## 2019-03-24 DIAGNOSIS — E039 Hypothyroidism, unspecified: Secondary | ICD-10-CM

## 2019-03-24 DIAGNOSIS — Z1239 Encounter for other screening for malignant neoplasm of breast: Secondary | ICD-10-CM | POA: Diagnosis not present

## 2019-03-24 DIAGNOSIS — R300 Dysuria: Secondary | ICD-10-CM

## 2019-03-24 DIAGNOSIS — E785 Hyperlipidemia, unspecified: Secondary | ICD-10-CM

## 2019-03-24 DIAGNOSIS — I10 Essential (primary) hypertension: Secondary | ICD-10-CM

## 2019-03-24 DIAGNOSIS — J3089 Other allergic rhinitis: Secondary | ICD-10-CM

## 2019-03-24 LAB — POCT URINALYSIS DIP (CLINITEK)
Bilirubin, UA: NEGATIVE
Glucose, UA: NEGATIVE mg/dL
Ketones, POC UA: NEGATIVE mg/dL
Nitrite, UA: NEGATIVE
POC PROTEIN,UA: NEGATIVE
Spec Grav, UA: 1.025 (ref 1.010–1.025)
Urobilinogen, UA: 0.2 E.U./dL
pH, UA: 5 (ref 5.0–8.0)

## 2019-03-24 NOTE — Telephone Encounter (Signed)
Patient is calling and was seen in the office today. Last time she had lab work done was 12/21/2018. She would like to know if she can wait until December to get more labwork. Please contact pt .

## 2019-03-24 NOTE — Telephone Encounter (Signed)
Routing to Dr. Corum for advice ? 

## 2019-03-24 NOTE — Patient Instructions (Addendum)
Take synthroid in the early morning with water away from other medications and vitamins Metoprolol -take in the evening Lisinopril and HCTZ -take in the morning Take blood pressure first thing in the morning

## 2019-03-24 NOTE — Progress Notes (Signed)
New Patient Office Visit  Subjective:  Patient ID: Carrie Osborne, female    DOB: [birth year]  Age: 78 y.o. MRN: [id number]  CC:  Chief Complaint  Patient presents with  . Establish Care  chronic medical concerns-HTN/Hypothyroid HPI Carrie Osborne presents for HTN/Hypothyroid TSH-.76 Triglycerides 194 o/w normal  Glucose 113 CBC 15.6/46.8 Recent flu shot Dr. Virgel Paling Past Medical History:  Diagnosis Date  . Arthritis   . Baker's cyst   . Hypothyroidism   . Osteoarthritis     Past Surgical History:  Procedure Laterality Date  . ABDOMINAL HYSTERECTOMY    . COLONOSCOPY N/A 07/04/2017   Procedure: COLONOSCOPY;  Surgeon: Daneil Dolin, MD;  Location: [facility];  Service: Endoscopy;  Laterality: N/A;  12:45pm  . TONSILLECTOMY      Family History  Problem Relation Age of Onset  . Colon cancer Neg Hx     Social History   Socioeconomic History  . Marital status: Married    Spouse name: Not on file  . Number of children: Not on file  . Years of education: Not on file  . Highest education level: Not on file  Occupational History  . Not on file  Social Needs  . Financial resource strain: Not on file  . Food insecurity    Worry: Not on file    Inability: Not on file  . Transportation needs    Medical: Not on file    Non-medical: Not on file  Tobacco Use  . Smoking status: Never Smoker  . Smokeless tobacco: Never Used  Substance and Sexual Activity  . Alcohol use: No  . Drug use: No  . Sexual activity: Not on file  Lifestyle  . Physical activity    Days per week: Not on file    Minutes per session: Not on file  . Stress: Not on file  Relationships  . Social Herbalist on phone: Not on file    Gets together: Not on file    Attends religious service: Not on file    Active member of club or organization: Not on file    Attends meetings of clubs or organizations: Not on file    Relationship status: Not on file  . Intimate partner  violence    Fear of current or ex partner: Not on file    Emotionally abused: Not on file    Physically abused: Not on file    Forced sexual activity: Not on file  Other Topics Concern  . Not on file  Social History Narrative  . Not on file    ROS Review of Systems  Constitutional: Negative.   HENT:       ENT -Arena-left ear drum rupture  Eyes:       Cataracts  Respiratory: Negative.   Cardiovascular: Negative.  Negative for chest pain.  Gastrointestinal: Negative.   Skin:       Dr. Nevada Crane  Allergic/Immunologic: Positive for environmental allergies.  Neurological: Negative.   Hematological: Negative.   Psychiatric/Behavioral: Negative.     Objective:   Today's Vitals: BP (!) 153/85 (BP Location: Right Arm, Patient Position: Sitting, Cuff Size: Normal)   Pulse 74   Temp 98.1 F (36.7 C) (Oral)   Ht _0  (1.6 m)   Wt 176 lb 9.6 oz (80.1 kg)   SpO2 98%   BMI 31.28 kg/m   Physical Exam Constitutional:      Appearance: Normal appearance.  HENT:  Head: Normocephalic and atraumatic.     Right Ear: Tympanic membrane, ear canal and external ear normal.     Left Ear: External ear normal.     Ears:     Comments: Left TM-rupture Neck:     Musculoskeletal: Normal range of motion.  Cardiovascular:     Rate and Rhythm: Normal rate and regular rhythm.     Pulses: Normal pulses.     Heart sounds: Normal heart sounds.  Pulmonary:     Effort: Pulmonary effort is normal.     Breath sounds: Normal breath sounds.  Neurological:     Mental Status: She is alert and oriented to person, place, and time.  Psychiatric:        Behavior: Behavior normal.     Assessment & Plan:     Outpatient Encounter Medications as of 03/24/2019  Medication Sig  . hydrochlorothiazide (HYDRODIURIL) 25 MG tablet Take 25 mg by mouth daily.  Marland Kitchen levothyroxine (SYNTHROID) 75 MCG tablet Take 75 mcg by mouth daily before breakfast.  . levothyroxine (SYNTHROID) 75 MCG tablet Take 75 mcg by  mouth daily before breakfast.  . lisinopril (ZESTRIL) 10 MG tablet Take 10 mg by mouth daily.  . metoprolol tartrate (LOPRESSOR) 100 MG tablet Take 100 mg by mouth 2 (two) times daily.  . Calcium Carbonate-Vitamin D (CALCIUM-VITAMIN D3 PO) Take 1 tablet by mouth 2 (two) times daily.  . cetirizine (ZYRTEC) 10 MG tablet Take 10 mg by mouth daily.  . Coenzyme Q10 (CO Q-10) 100 MG CAPS Take 100 mg by mouth daily.   . mometasone (NASONEX) 50 MCG/ACT nasal spray Place 1-2 sprays into the nose daily as needed (for allergies).   . Multiple Vitamins-Minerals (CENTRUM SILVER PO) Take 1 tablet by mouth daily.  . Na Sulfate-K Sulfate-Mg Sulf 17.5-3.13-1.6 GM/177ML SOLN Take 1 kit by mouth as directed.  . Omega-3 Fatty Acids (FISH OIL) 1200 MG CAPS Take 1,200 mg by mouth daily.   . simvastatin (ZOCOR) 40 MG tablet Take 40 mg by mouth at bedtime.   . [DISCONTINUED] levothyroxine (SYNTHROID, LEVOTHROID) 50 MCG tablet Take 50 mcg by mouth daily.    No facility-administered encounter medications on file as of 03/24/2019.    1. Essential hypertension D/w pt medication and how to dose - Lipid panel - Comprehensive Metabolic Panel (CMET) - TSH - Urine Microscopic - POCT URINALYSIS DIP (CLINITEK)  2. Screening for breast cancer mammogram  3. Dysuria - POCT URINALYSIS DIP (CLINITEK)  4. Seasonal allergic rhinitis due to other allergic trigger Continue nasanex 5. Hypothyroidism, unspecified type Synthroid-TSH  6. Hyperlipidemia, unspecified hyperlipidemia type Lipid panel Follow-up: 6 months  LISA Hannah Beat, MD

## 2019-03-25 ENCOUNTER — Other Ambulatory Visit: Payer: Self-pay | Admitting: Family Medicine

## 2019-03-25 DIAGNOSIS — R319 Hematuria, unspecified: Secondary | ICD-10-CM

## 2019-03-25 LAB — POCT URINALYSIS DIP (CLINITEK)
Bilirubin, UA: NEGATIVE
Glucose, UA: NEGATIVE mg/dL
Ketones, POC UA: NEGATIVE mg/dL
Leukocytes, UA: NEGATIVE
Nitrite, UA: NEGATIVE
POC PROTEIN,UA: NEGATIVE
Spec Grav, UA: 1.025 (ref 1.010–1.025)
Urobilinogen, UA: 0.2 E.U./dL
pH, UA: 5 (ref 5.0–8.0)

## 2019-03-25 NOTE — Telephone Encounter (Signed)
Patient is aware 

## 2019-03-25 NOTE — Telephone Encounter (Signed)
If we can have a copy of most recent blood work for her chart, she can wait.  We currently do not have the blood work from 6/20

## 2019-03-26 ENCOUNTER — Other Ambulatory Visit (HOSPITAL_COMMUNITY): Payer: Self-pay | Admitting: Family Medicine

## 2019-03-26 DIAGNOSIS — Z1231 Encounter for screening mammogram for malignant neoplasm of breast: Secondary | ICD-10-CM

## 2019-03-26 LAB — URINE CULTURE
MICRO NUMBER:: 944727
SPECIMEN QUALITY:: ADEQUATE

## 2019-03-28 DIAGNOSIS — Z1239 Encounter for other screening for malignant neoplasm of breast: Secondary | ICD-10-CM | POA: Insufficient documentation

## 2019-03-28 DIAGNOSIS — R300 Dysuria: Secondary | ICD-10-CM | POA: Insufficient documentation

## 2019-03-28 DIAGNOSIS — E039 Hypothyroidism, unspecified: Secondary | ICD-10-CM | POA: Insufficient documentation

## 2019-03-28 DIAGNOSIS — E785 Hyperlipidemia, unspecified: Secondary | ICD-10-CM | POA: Insufficient documentation

## 2019-03-28 DIAGNOSIS — I10 Essential (primary) hypertension: Secondary | ICD-10-CM | POA: Insufficient documentation

## 2019-03-28 DIAGNOSIS — J309 Allergic rhinitis, unspecified: Secondary | ICD-10-CM | POA: Insufficient documentation

## 2019-04-01 DIAGNOSIS — Z08 Encounter for follow-up examination after completed treatment for malignant neoplasm: Secondary | ICD-10-CM | POA: Diagnosis not present

## 2019-04-01 DIAGNOSIS — D485 Neoplasm of uncertain behavior of skin: Secondary | ICD-10-CM | POA: Diagnosis not present

## 2019-04-01 DIAGNOSIS — X32XXXD Exposure to sunlight, subsequent encounter: Secondary | ICD-10-CM | POA: Diagnosis not present

## 2019-04-01 DIAGNOSIS — Z85828 Personal history of other malignant neoplasm of skin: Secondary | ICD-10-CM | POA: Diagnosis not present

## 2019-04-01 DIAGNOSIS — L570 Actinic keratosis: Secondary | ICD-10-CM | POA: Diagnosis not present

## 2019-04-01 DIAGNOSIS — D225 Melanocytic nevi of trunk: Secondary | ICD-10-CM | POA: Diagnosis not present

## 2019-05-05 ENCOUNTER — Ambulatory Visit (HOSPITAL_COMMUNITY): Payer: Medicare Other

## 2019-05-06 DIAGNOSIS — X32XXXD Exposure to sunlight, subsequent encounter: Secondary | ICD-10-CM | POA: Diagnosis not present

## 2019-05-06 DIAGNOSIS — D485 Neoplasm of uncertain behavior of skin: Secondary | ICD-10-CM | POA: Diagnosis not present

## 2019-05-06 DIAGNOSIS — S50862A Insect bite (nonvenomous) of left forearm, initial encounter: Secondary | ICD-10-CM | POA: Diagnosis not present

## 2019-05-06 DIAGNOSIS — L570 Actinic keratosis: Secondary | ICD-10-CM | POA: Diagnosis not present

## 2019-05-06 DIAGNOSIS — L820 Inflamed seborrheic keratosis: Secondary | ICD-10-CM | POA: Diagnosis not present

## 2019-06-02 ENCOUNTER — Telehealth: Payer: Self-pay

## 2019-06-02 DIAGNOSIS — I10 Essential (primary) hypertension: Secondary | ICD-10-CM

## 2019-06-02 LAB — COMPREHENSIVE METABOLIC PANEL
AG Ratio: 1.6 (calc) (ref 1.0–2.5)
ALT: 15 U/L (ref 6–29)
AST: 21 U/L (ref 10–35)
Albumin: 4.2 g/dL (ref 3.6–5.1)
Alkaline phosphatase (APISO): 79 U/L (ref 37–153)
BUN: 11 mg/dL (ref 7–25)
CO2: 30 mmol/L (ref 20–32)
Calcium: 9.8 mg/dL (ref 8.6–10.4)
Chloride: 98 mmol/L (ref 98–110)
Creat: 0.79 mg/dL (ref 0.60–0.93)
Globulin: 2.6 g/dL (calc) (ref 1.9–3.7)
Glucose, Bld: 107 mg/dL — ABNORMAL HIGH (ref 65–99)
Potassium: 4.1 mmol/L (ref 3.5–5.3)
Sodium: 137 mmol/L (ref 135–146)
Total Bilirubin: 2 mg/dL — ABNORMAL HIGH (ref 0.2–1.2)
Total Protein: 6.8 g/dL (ref 6.1–8.1)

## 2019-06-02 LAB — TSH: TSH: 1.65 mIU/L (ref 0.40–4.50)

## 2019-06-02 LAB — LIPID PANEL
Cholesterol: 162 mg/dL (ref <200)
HDL: 49 mg/dL — ABNORMAL LOW (ref >=50)
LDL Cholesterol (Calc): 87 mg/dL (calc)
Non-HDL Cholesterol (Calc): 113 mg/dL (calc) (ref <130)
Total CHOL/HDL Ratio: 3.3 (calc) (ref <5.0)
Triglycerides: 165 mg/dL — ABNORMAL HIGH (ref <150)

## 2019-06-02 MED ORDER — HYDROCHLOROTHIAZIDE 25 MG PO TABS: 25.0000 mg | ORAL_TABLET | Freq: Every day | ORAL | 0 refills | Status: DC

## 2019-06-02 NOTE — Telephone Encounter (Signed)
Carrie Osborne, CMA  

## 2019-07-07 ENCOUNTER — Telehealth: Payer: Self-pay

## 2019-07-07 DIAGNOSIS — I10 Essential (primary) hypertension: Secondary | ICD-10-CM

## 2019-07-07 MED ORDER — METOPROLOL TARTRATE 100 MG PO TABS: 100.0000 mg | ORAL_TABLET | Freq: Two times a day (BID) | ORAL | 0 refills | Status: DC

## 2019-07-07 NOTE — Telephone Encounter (Signed)
LeighAnn Catha Ontko, CMA  

## 2019-07-20 ENCOUNTER — Telehealth: Payer: Self-pay | Admitting: Family Medicine

## 2019-07-20 NOTE — Telephone Encounter (Signed)
Patient is calling and states she picked up medication from the pharmacy that is different size and color and is wanting to speak with the nurse about it.

## 2019-07-20 NOTE — Telephone Encounter (Signed)
I left her a voicemail to return my call.

## 2019-07-21 NOTE — Telephone Encounter (Signed)
Discussed medication with pt and Dr. Holly Bodily. Called [facility] and changed metoprolol tartrate to metoprolol ER 100mg  daily per Dr. Charlestine Massed orders.

## 2019-07-21 NOTE — Telephone Encounter (Signed)
Patient called back, routing to [place] as she is covering for [place] today.

## 2019-08-05 DIAGNOSIS — Z23 Encounter for immunization: Secondary | ICD-10-CM | POA: Diagnosis not present

## 2019-08-27 ENCOUNTER — Other Ambulatory Visit: Payer: Self-pay | Admitting: Family Medicine

## 2019-08-27 DIAGNOSIS — I10 Essential (primary) hypertension: Secondary | ICD-10-CM

## 2019-08-30 ENCOUNTER — Other Ambulatory Visit: Payer: Self-pay

## 2019-08-30 ENCOUNTER — Telehealth: Payer: Self-pay | Admitting: Family Medicine

## 2019-08-30 DIAGNOSIS — E039 Hypothyroidism, unspecified: Secondary | ICD-10-CM

## 2019-08-30 MED ORDER — LEVOTHYROXINE SODIUM 75 MCG PO TABS: 75.0000 ug | ORAL_TABLET | Freq: Every day | ORAL | 1 refills | Status: AC

## 2019-08-30 NOTE — Telephone Encounter (Signed)
Routing to [place] since Beverlee Nims is filling in at another office.

## 2019-08-30 NOTE — Telephone Encounter (Signed)
Patient is calling and would like a refill on the following medication for a 3 month supply.  levothyroxine (SYNTHROID) 75 MCG tablet

## 2019-08-30 NOTE — Telephone Encounter (Signed)
Rx has already been sent on 08/30/19

## 2019-09-03 DIAGNOSIS — Z23 Encounter for immunization: Secondary | ICD-10-CM | POA: Diagnosis not present

## 2019-09-16 DIAGNOSIS — H3562 Retinal hemorrhage, left eye: Secondary | ICD-10-CM | POA: Diagnosis not present

## 2019-09-16 DIAGNOSIS — H35042 Retinal micro-aneurysms, unspecified, left eye: Secondary | ICD-10-CM | POA: Diagnosis not present

## 2019-09-16 DIAGNOSIS — H353132 Nonexudative age-related macular degeneration, bilateral, intermediate dry stage: Secondary | ICD-10-CM | POA: Diagnosis not present

## 2019-09-16 DIAGNOSIS — H3561 Retinal hemorrhage, right eye: Secondary | ICD-10-CM | POA: Diagnosis not present

## 2019-09-16 DIAGNOSIS — H348312 Tributary (branch) retinal vein occlusion, right eye, stable: Secondary | ICD-10-CM | POA: Diagnosis not present

## 2019-09-23 ENCOUNTER — Ambulatory Visit: Payer: Medicare Other | Admitting: Family Medicine

## 2019-10-11 DIAGNOSIS — E039 Hypothyroidism, unspecified: Secondary | ICD-10-CM | POA: Diagnosis not present

## 2019-10-11 DIAGNOSIS — E785 Hyperlipidemia, unspecified: Secondary | ICD-10-CM | POA: Diagnosis not present

## 2019-10-11 DIAGNOSIS — I10 Essential (primary) hypertension: Secondary | ICD-10-CM | POA: Diagnosis not present

## 2019-10-11 DIAGNOSIS — J302 Other seasonal allergic rhinitis: Secondary | ICD-10-CM | POA: Diagnosis not present

## 2019-10-11 DIAGNOSIS — E559 Vitamin D deficiency, unspecified: Secondary | ICD-10-CM | POA: Diagnosis not present

## 2019-10-11 DIAGNOSIS — Z0189 Encounter for other specified special examinations: Secondary | ICD-10-CM | POA: Diagnosis not present

## 2019-10-28 DIAGNOSIS — E039 Hypothyroidism, unspecified: Secondary | ICD-10-CM | POA: Diagnosis not present

## 2019-10-28 DIAGNOSIS — E559 Vitamin D deficiency, unspecified: Secondary | ICD-10-CM | POA: Diagnosis not present

## 2019-10-28 DIAGNOSIS — J302 Other seasonal allergic rhinitis: Secondary | ICD-10-CM | POA: Diagnosis not present

## 2019-10-28 DIAGNOSIS — Z0189 Encounter for other specified special examinations: Secondary | ICD-10-CM | POA: Diagnosis not present

## 2019-10-28 DIAGNOSIS — E785 Hyperlipidemia, unspecified: Secondary | ICD-10-CM | POA: Diagnosis not present

## 2019-10-28 DIAGNOSIS — I10 Essential (primary) hypertension: Secondary | ICD-10-CM | POA: Diagnosis not present

## 2019-11-03 DIAGNOSIS — E785 Hyperlipidemia, unspecified: Secondary | ICD-10-CM | POA: Diagnosis not present

## 2019-11-03 DIAGNOSIS — E559 Vitamin D deficiency, unspecified: Secondary | ICD-10-CM | POA: Diagnosis not present

## 2019-11-03 DIAGNOSIS — E039 Hypothyroidism, unspecified: Secondary | ICD-10-CM | POA: Diagnosis not present

## 2019-11-03 DIAGNOSIS — I10 Essential (primary) hypertension: Secondary | ICD-10-CM | POA: Diagnosis not present

## 2019-11-03 DIAGNOSIS — J302 Other seasonal allergic rhinitis: Secondary | ICD-10-CM | POA: Diagnosis not present

## 2019-11-08 ENCOUNTER — Other Ambulatory Visit: Payer: Self-pay | Admitting: Family Medicine

## 2019-11-08 DIAGNOSIS — I10 Essential (primary) hypertension: Secondary | ICD-10-CM

## 2020-02-25 ENCOUNTER — Other Ambulatory Visit: Payer: Self-pay | Admitting: Family Medicine

## 2020-02-25 DIAGNOSIS — E039 Hypothyroidism, unspecified: Secondary | ICD-10-CM

## 2020-03-10 DIAGNOSIS — I10 Essential (primary) hypertension: Secondary | ICD-10-CM | POA: Diagnosis not present

## 2020-03-10 DIAGNOSIS — E785 Hyperlipidemia, unspecified: Secondary | ICD-10-CM | POA: Diagnosis not present

## 2020-03-10 DIAGNOSIS — E039 Hypothyroidism, unspecified: Secondary | ICD-10-CM | POA: Diagnosis not present

## 2020-03-16 DIAGNOSIS — E559 Vitamin D deficiency, unspecified: Secondary | ICD-10-CM | POA: Diagnosis not present

## 2020-03-16 DIAGNOSIS — J302 Other seasonal allergic rhinitis: Secondary | ICD-10-CM | POA: Diagnosis not present

## 2020-03-16 DIAGNOSIS — E039 Hypothyroidism, unspecified: Secondary | ICD-10-CM | POA: Diagnosis not present

## 2020-03-16 DIAGNOSIS — I10 Essential (primary) hypertension: Secondary | ICD-10-CM | POA: Diagnosis not present

## 2020-03-16 DIAGNOSIS — E785 Hyperlipidemia, unspecified: Secondary | ICD-10-CM | POA: Diagnosis not present

## 2020-03-20 ENCOUNTER — Other Ambulatory Visit: Payer: Self-pay

## 2020-03-20 ENCOUNTER — Ambulatory Visit (INDEPENDENT_AMBULATORY_CARE_PROVIDER_SITE_OTHER): Payer: Medicare Other | Admitting: Ophthalmology

## 2020-03-20 ENCOUNTER — Encounter (INDEPENDENT_AMBULATORY_CARE_PROVIDER_SITE_OTHER): Payer: Self-pay | Admitting: Ophthalmology

## 2020-03-20 DIAGNOSIS — H348312 Tributary (branch) retinal vein occlusion, right eye, stable: Secondary | ICD-10-CM

## 2020-03-20 DIAGNOSIS — H34832 Tributary (branch) retinal vein occlusion, left eye, with macular edema: Secondary | ICD-10-CM

## 2020-03-20 DIAGNOSIS — H43813 Vitreous degeneration, bilateral: Secondary | ICD-10-CM | POA: Diagnosis not present

## 2020-03-20 DIAGNOSIS — H353132 Nonexudative age-related macular degeneration, bilateral, intermediate dry stage: Secondary | ICD-10-CM | POA: Diagnosis not present

## 2020-03-20 NOTE — Assessment & Plan Note (Signed)

## 2020-03-20 NOTE — Assessment & Plan Note (Signed)
Chronic, OS yet stabilized post sector focal laser photocoagulation left eye will observe

## 2020-03-20 NOTE — Progress Notes (Signed)
03/20/2020     CHIEF COMPLAINT Patient presents for Retina Follow Up   HISTORY OF PRESENT ILLNESS: Carrie Osborne is a 79 y.o. female who presents to the clinic today for:   HPI    Retina Follow Up    Patient presents with  Dry AMD.  In both eyes.  This started 6 months ago.  Severity is mild.  Duration of 6 months.  Since onset it is stable.          Comments    6 Month AMD F/U OU  Pt denies noticeable changes to [place] OU since last visit. Pt denies ocular pain, flashes of light, or floaters OU.         Last edited by Rockie Neighbours, [place] on 03/20/2020  9:46 AM. (History)      Referring physician: Maryruth Hancock, MD [address] [place],  [place] [address]  HISTORICAL INFORMATION:   Selected notes from the MEDICAL RECORD NUMBER       CURRENT MEDICATIONS: No current outpatient medications on file. (Ophthalmic Drugs)   No current facility-administered medications for this visit. (Ophthalmic Drugs)   Current Outpatient Medications (Other)  Medication Sig  . Calcium Carbonate-Vitamin D (CALCIUM-VITAMIN D3 PO) Take 1 tablet by mouth 2 (two) times daily.  . cetirizine (ZYRTEC) 10 MG tablet Take 10 mg by mouth daily.  . Coenzyme Q10 (CO Q-10) 100 MG CAPS Take 100 mg by mouth daily.   . hydrochlorothiazide (HYDRODIURIL) 25 MG tablet Take 1 tablet by mouth once daily  . levothyroxine (SYNTHROID) 75 MCG tablet Take 1 tablet (75 mcg total) by mouth daily before breakfast.  . lisinopril (ZESTRIL) 10 MG tablet Take 10 mg by mouth daily.  . metoprolol succinate (TOPROL-XL) 100 MG 24 hr tablet Take 100 mg by mouth daily. Take with or immediately following a meal.  . mometasone (NASONEX) 50 MCG/ACT nasal spray Place 1-2 sprays into the nose daily as needed (for allergies).   . Multiple Vitamins-Minerals (CENTRUM SILVER PO) Take 1 tablet by mouth daily.  . Na Sulfate-K Sulfate-Mg Sulf 17.5-3.13-1.6 GM/177ML SOLN Take 1 kit by mouth as directed.  . Omega-3 Fatty Acids (FISH OIL) 1200  MG CAPS Take 1,200 mg by mouth daily.   . simvastatin (ZOCOR) 40 MG tablet Take 40 mg by mouth at bedtime.    No current facility-administered medications for this visit. (Other)      REVIEW OF SYSTEMS:    ALLERGIES Allergies  Allergen Reactions  . Penicillins Hives, Itching, Rash and Other (See Comments)    Has patient had a PCN reaction causing immediate rash, facial/tongue/throat swelling, SOB or lightheadedness with hypotension: No Has patient had a PCN reaction causing severe rash involving mucus membranes or skin necrosis: No Has patient had a PCN reaction that required hospitalization: No Has patient had a PCN reaction occurring within the last 10 years: Yes If all of the above answers are "NO", then may proceed with Cephalosporin use.     PAST MEDICAL HISTORY Past Medical History:  Diagnosis Date  . Arthritis   . Baker's cyst   . Hypothyroidism   . Osteoarthritis    Past Surgical History:  Procedure Laterality Date  . ABDOMINAL HYSTERECTOMY    . COLONOSCOPY N/A 07/04/2017   Procedure: COLONOSCOPY;  Surgeon: Daneil Dolin, MD;  Location: [facility];  Service: Endoscopy;  Laterality: N/A;  12:45pm  . TONSILLECTOMY      FAMILY HISTORY Family History  Problem Relation Age of  Onset  . Colon cancer Neg Hx     SOCIAL HISTORY Social History   Tobacco Use  . Smoking status: Never Smoker  . Smokeless tobacco: Never Used  Vaping Use  . Vaping Use: Never used  Substance Use Topics  . Alcohol use: No  . Drug use: No         OPHTHALMIC EXAM: Base Eye Exam    Visual Acuity (ETDRS)      Right Left   Dist cc 20/20 20/25 +2   Correction: Glasses       Tonometry (Tonopen, 9:48 AM)      Right Left   Pressure 15 16       Pupils      Pupils Dark Light Shape React APD   Right PERRL 3 2 Round Brisk None   Left PERRL 3 2 Round Brisk None       Visual Fields (Counting fingers)      Left Right     Full   Restrictions Partial outer inferior temporal  deficiency        Extraocular Movement      Right Left    Full Full       Neuro/Psych    Oriented x3: Yes   Mood/Affect: Normal       Dilation    Both eyes: 1.0% Mydriacyl, 2.5% Phenylephrine @ 9:51 AM        Slit Lamp and Fundus Exam    External Exam      Right Left   External Normal Normal       Slit Lamp Exam      Right Left   Lids/Lashes Normal Normal   Conjunctiva/Sclera White and quiet White and quiet   Cornea Clear Clear   Anterior Chamber Deep and quiet Deep and quiet   Iris Round and reactive Round and reactive   Lens 2+ Nuclear sclerosis 2+ Nuclear sclerosis   Anterior Vitreous Normal Normal       Fundus Exam      Right Left   Posterior Vitreous Posterior vitreous detachment Posterior vitreous detachment   Disc Normal Normal   C/D Ratio 0.55 0.55   Macula Microaneurysms, no macular thickening, Soft drusen, no exudates, no hemorrhage Microaneurysms, no macular thickening, Soft drusen, no exudates, no hemorrhage   Vessels Old branch retinal vein occlusion, with microaneurysms, not active Old branch retinal vein occlusion, with minor CME temporal to the fovea, not in the FAZ   Periphery Normal Normal          IMAGING AND PROCEDURES  Imaging and Procedures for 03/20/20  OCT, Retina - OU - Both Eyes       Right Eye Quality was good. Scan locations included subfoveal. Central Foveal Thickness: 280. Progression has been stable.   Left Eye Quality was good. Scan locations included subfoveal. Central Foveal Thickness: 280. Findings include cystoid macular edema.   Notes OD, no active maculopathy, OS with chronic ongoing CME temporal to the fovea, mostly stable currently at 57-monthinterval will observe                ASSESSMENT/PLAN:  Branch retinal vein occlusion with macular edema of left eye Chronic, OS yet stabilized post sector focal laser photocoagulation left eye will observe       ICD-10-CM   1. Stable branch retinal vein  occlusion of right eye  H34.8312 OCT, Retina - OU - Both Eyes  2. Intermediate stage nonexudative age-related macular degeneration of both eyes  H35.3132   3. Posterior vitreous detachment of both eyes  H43.813   4. Branch retinal vein occlusion with macular edema of left eye  H34.8320     1.  Will monitor maculopathy OU, left eye not threatened currently, at 63-monthinterval follow-up 2.  3.  Ophthalmic Meds Ordered this visit:  No orders of the defined types were placed in this encounter.      Return in about 6 months (around 09/17/2020) for COLOR FP, OCT, DILATE OU.  There are no Patient Instructions on file for this visit.   Explained the diagnoses, plan, and follow up with the patient and they expressed understanding.  Patient expressed understanding of the importance of proper follow up care.   GClent DemarkRankin M.D. Diseases & Surgery of the Retina and Vitreous [facility] [place]09/27/21     Abbreviations: M myopia (nearsighted); A astigmatism; H hyperopia (farsighted); P presbyopia; Mrx spectacle prescription;  CTL contact lenses; OD right eye; OS left eye; OU both eyes  XT exotropia; ET esotropia; PEK punctate epithelial keratitis; PEE punctate epithelial erosions; DES dry eye syndrome; MGD meibomian gland dysfunction; ATs artificial tears; PFAT's preservative free artificial tears; NCordavillenuclear sclerotic cataract; PSC posterior subcapsular cataract; ERM epi-retinal membrane; PVD posterior vitreous detachment; RD retinal detachment; DM diabetes mellitus; DR diabetic retinopathy; NPDR non-proliferative diabetic retinopathy; PDR proliferative diabetic retinopathy; CSME clinically significant macular edema; DME diabetic macular edema; dbh dot blot hemorrhages; CWS cotton wool spot; POAG primary open angle glaucoma; C/D cup-to-disc ratio; HVF humphrey visual field; GVF goldmann visual field; OCT optical coherence tomography; IOP intraocular pressure; BRVO Branch retinal  vein occlusion; CRVO central retinal vein occlusion; CRAO central retinal artery occlusion; BRAO branch retinal artery occlusion; RT retinal tear; SB scleral buckle; PPV pars plana vitrectomy; VH Vitreous hemorrhage; PRP panretinal laser photocoagulation; IVK intravitreal kenalog; VMT vitreomacular traction; MH Macular hole;  NVD neovascularization of the disc; NVE neovascularization elsewhere; AREDS age related eye disease study; ARMD age related macular degeneration; POAG primary open angle glaucoma; EBMD epithelial/anterior basement membrane dystrophy; ACIOL anterior chamber intraocular lens; IOL intraocular lens; PCIOL posterior chamber intraocular lens; Phaco/IOL phacoemulsification with intraocular lens placement; PSalemphotorefractive keratectomy; LASIK laser assisted in situ keratomileusis; HTN hypertension; DM diabetes mellitus; COPD chronic obstructive pulmonary disease

## 2020-03-30 DIAGNOSIS — Z23 Encounter for immunization: Secondary | ICD-10-CM | POA: Diagnosis not present

## 2020-04-26 DIAGNOSIS — Z23 Encounter for immunization: Secondary | ICD-10-CM | POA: Diagnosis not present

## 2020-07-05 DIAGNOSIS — E785 Hyperlipidemia, unspecified: Secondary | ICD-10-CM | POA: Diagnosis not present

## 2020-07-05 DIAGNOSIS — J302 Other seasonal allergic rhinitis: Secondary | ICD-10-CM | POA: Diagnosis not present

## 2020-07-05 DIAGNOSIS — I10 Essential (primary) hypertension: Secondary | ICD-10-CM | POA: Diagnosis not present

## 2020-07-05 DIAGNOSIS — Z712 Person consulting for explanation of examination or test findings: Secondary | ICD-10-CM | POA: Diagnosis not present

## 2020-07-05 DIAGNOSIS — E039 Hypothyroidism, unspecified: Secondary | ICD-10-CM | POA: Diagnosis not present

## 2020-07-05 DIAGNOSIS — E559 Vitamin D deficiency, unspecified: Secondary | ICD-10-CM | POA: Diagnosis not present

## 2020-07-12 DIAGNOSIS — I10 Essential (primary) hypertension: Secondary | ICD-10-CM | POA: Diagnosis not present

## 2020-07-12 DIAGNOSIS — E785 Hyperlipidemia, unspecified: Secondary | ICD-10-CM | POA: Diagnosis not present

## 2020-07-12 DIAGNOSIS — E559 Vitamin D deficiency, unspecified: Secondary | ICD-10-CM | POA: Diagnosis not present

## 2020-07-12 DIAGNOSIS — J302 Other seasonal allergic rhinitis: Secondary | ICD-10-CM | POA: Diagnosis not present

## 2020-07-12 DIAGNOSIS — E039 Hypothyroidism, unspecified: Secondary | ICD-10-CM | POA: Diagnosis not present

## 2020-09-18 ENCOUNTER — Encounter (INDEPENDENT_AMBULATORY_CARE_PROVIDER_SITE_OTHER): Payer: Self-pay | Admitting: Ophthalmology

## 2020-09-18 ENCOUNTER — Other Ambulatory Visit: Payer: Self-pay

## 2020-09-18 ENCOUNTER — Ambulatory Visit (INDEPENDENT_AMBULATORY_CARE_PROVIDER_SITE_OTHER): Payer: Medicare Other | Admitting: Ophthalmology

## 2020-09-18 DIAGNOSIS — H353132 Nonexudative age-related macular degeneration, bilateral, intermediate dry stage: Secondary | ICD-10-CM | POA: Diagnosis not present

## 2020-09-18 DIAGNOSIS — H348322 Tributary (branch) retinal vein occlusion, left eye, stable: Secondary | ICD-10-CM

## 2020-09-18 DIAGNOSIS — H348312 Tributary (branch) retinal vein occlusion, right eye, stable: Secondary | ICD-10-CM

## 2020-09-18 NOTE — Assessment & Plan Note (Signed)
Stable OD, no recurrences

## 2020-09-18 NOTE — Assessment & Plan Note (Signed)
No active changes left eye, CME not center involved observe

## 2020-09-18 NOTE — Progress Notes (Signed)
09/18/2020     CHIEF COMPLAINT Patient presents for Retina Follow Up (6 Mo F/U OU//Pt denies noticeable changes to [place] OU since last visit. Pt denies ocular pain, flashes of light, or floaters OU. //)   HISTORY OF PRESENT ILLNESS: Carrie Osborne is a 80 y.o. female who presents to the clinic today for:   HPI    Retina Follow Up    Patient presents with  CRVO/BRVO.  In right eye.  This started 6 months ago.  Severity is mild.  Duration of 6 months.  Since onset it is stable. Additional comments: 6 Mo F/U OU  Pt denies noticeable changes to [place] OU since last visit. Pt denies ocular pain, flashes of light, or floaters OU.          Last edited by Rockie Neighbours, [place] on 09/18/2020 10:28 AM. (History)      Referring physician: Celene Squibb, MD [place],  [place] [address]  HISTORICAL INFORMATION:   Selected notes from the [facility]: No current outpatient medications on file. (Ophthalmic Drugs)   No current facility-administered medications for this visit. (Ophthalmic Drugs)   Current Outpatient Medications (Other)  Medication Sig  . Calcium Carbonate-Vitamin D (CALCIUM-VITAMIN D3 PO) Take 1 tablet by mouth 2 (two) times daily.  . cetirizine (ZYRTEC) 10 MG tablet Take 10 mg by mouth daily.  . Coenzyme Q10 (CO Q-10) 100 MG CAPS Take 100 mg by mouth daily.   . hydrochlorothiazide (HYDRODIURIL) 25 MG tablet Take 1 tablet by mouth once daily  . levothyroxine (SYNTHROID) 75 MCG tablet Take 1 tablet (75 mcg total) by mouth daily before breakfast.  . lisinopril (ZESTRIL) 10 MG tablet Take 10 mg by mouth daily.  . metoprolol succinate (TOPROL-XL) 100 MG 24 hr tablet Take 100 mg by mouth daily. Take with or immediately following a meal.  . mometasone (NASONEX) 50 MCG/ACT nasal spray Place 1-2 sprays into the nose daily as needed (for allergies).   . Multiple Vitamins-Minerals (CENTRUM SILVER PO) Take 1 tablet by mouth daily.  .  Na Sulfate-K Sulfate-Mg Sulf 17.5-3.13-1.6 GM/177ML SOLN Take 1 kit by mouth as directed.  . Omega-3 Fatty Acids (FISH OIL) 1200 MG CAPS Take 1,200 mg by mouth daily.   . simvastatin (ZOCOR) 40 MG tablet Take 40 mg by mouth at bedtime.    No current facility-administered medications for this visit. (Other)      REVIEW OF SYSTEMS:    ALLERGIES Allergies  Allergen Reactions  . Penicillins Hives, Itching, Rash and Other (See Comments)    Has patient had a PCN reaction causing immediate rash, facial/tongue/throat swelling, SOB or lightheadedness with hypotension: No Has patient had a PCN reaction causing severe rash involving mucus membranes or skin necrosis: No Has patient had a PCN reaction that required hospitalization: No Has patient had a PCN reaction occurring within the last 10 years: Yes If all of the above answers are "NO", then may proceed with Cephalosporin use.     PAST MEDICAL HISTORY Past Medical History:  Diagnosis Date  . Arthritis   . Baker's cyst   . Hypothyroidism   . Osteoarthritis    Past Surgical History:  Procedure Laterality Date  . ABDOMINAL HYSTERECTOMY    . COLONOSCOPY N/A 07/04/2017   Procedure: COLONOSCOPY;  Surgeon: Daneil Dolin, MD;  [facility]: [facility];  Service: Endoscopy;  Laterality: N/A;  12:45pm  . TONSILLECTOMY  FAMILY HISTORY Family History  Problem Relation Age of Onset  . Colon cancer Neg Hx     SOCIAL HISTORY Social History   Tobacco Use  . Smoking status: Never Smoker  . Smokeless tobacco: Never Used  Vaping Use  . Vaping Use: Never used  Substance Use Topics  . Alcohol use: No  . Drug use: No         OPHTHALMIC EXAM: Base Eye Exam    Visual Acuity (ETDRS)      Right Left   Dist cc 20/20 -1 20/25 +1   Correction: Glasses       Tonometry (Tonopen, 10:29 AM)      Right Left   Pressure 14 15       Pupils      Pupils Dark Light Shape React APD   Right PERRL 4 3 Round Brisk None   Left PERRL 4 3  Round Brisk None       Visual Fields (Counting fingers)      Left Right     Full   Restrictions Partial outer inferior temporal deficiency        Extraocular Movement      Right Left    Full Full       Neuro/Psych    Oriented x3: Yes   Mood/Affect: Normal       Dilation    Both eyes: 1.0% Mydriacyl, 2.5% Phenylephrine @ 10:33 AM        Slit Lamp and Fundus Exam    External Exam      Right Left   External Normal Normal       Slit Lamp Exam      Right Left   Lids/Lashes Normal Normal   Conjunctiva/Sclera White and quiet White and quiet   Cornea Clear Clear   Anterior Chamber Deep and quiet Deep and quiet   Iris Round and reactive Round and reactive   Lens 2+ Nuclear sclerosis 2+ Nuclear sclerosis   Anterior Vitreous Normal Normal       Fundus Exam      Right Left   Posterior Vitreous Posterior vitreous detachment Posterior vitreous detachment   Disc Normal Normal   C/D Ratio 0.55 0.55   Macula Microaneurysms, no macular thickening, Soft drusen, no exudates, no hemorrhage Microaneurysms, no macular thickening, Soft drusen, no exudates, no hemorrhage   Vessels Old branch retinal vein occlusion, with microaneurysms, not active Old branch retinal vein occlusion, with minor CME temporal to the fovea, not in the FAZ   Periphery Normal Normal          IMAGING AND PROCEDURES  Imaging and Procedures for 09/18/20  Color Fundus Photography Optos - OU - Both Eyes       Right Eye Progression has been stable. Vessels : normal observations. Periphery : normal observations.   Left Eye Progression has been stable. Disc findings include normal observations. Macula : normal observations. Periphery : normal observations.   Notes OD with incidental PVD.,  Quiescent BRVO ST distally OS.  No macular edema OU  OU with history of BRVO now stable       OCT, Retina - OU - Both Eyes       Right Eye Quality was good. Scan locations included subfoveal. Central Foveal  Thickness: 292. Progression has been stable. Findings include retinal drusen .   Left Eye Quality was good. Scan locations included subfoveal. Central Foveal Thickness: 267. Findings include cystoid macular edema, retinal drusen , no IRF.  Notes OD, no active maculopathy, OS with chronic ongoing CME temporal to the fovea, mostly stable currently at 49-month interval will observe                ASSESSMENT/PLAN:  Branch retinal vein occlusion with no neovascularization of left eye No active changes left eye, CME not center involved observe  Branch retinal vein occlusion of right eye Stable OD, no recurrences  Intermediate stage nonexudative age-related macular degeneration of both eyes The nature of age-related macular degeneration was discussed with the patient as well as the distinction between dry and wet types. Checking an Amsler Grid daily with advice to return immediately should a distortion develop, was given to the patient. The patient 's smoking status now and in the past was determined and advice based on the AREDS study was provided regarding the consumption of antioxidant supplements. AREDS 2 vitamin formulation was recommended. Consumption of dark leafy vegetables and fresh fruits of various colors was recommended. Treatment modalities for wet macular degeneration particularly the use of intravitreal injections of anti-blood vessel growth factors was discussed with the patient. Avastin, Lucentis, and Eylea are the available options. On occasion, therapy includes the use of photodynamic therapy and thermal laser. Stressed to the patient do not rub eyes.  Patient was advised to check Amsler Grid daily and return immediately if changes are noted. Instructions on using the grid were given to the patient. All patient questions were answered.      ICD-10-CM   1. Intermediate stage nonexudative age-related macular degeneration of both eyes  H35.3132 Color Fundus Photography Optos -  OU - Both Eyes    OCT, Retina - OU - Both Eyes  2. Stable branch retinal vein occlusion of right eye  Y70.6237 Color Fundus Photography Optos - OU - Both Eyes    OCT, Retina - OU - Both Eyes  3. Branch retinal vein occlusion with no neovascularization of left eye  H34.8322     1.  OU, at 91-month follow-up, no active maculopathy.  BRVO OU persistent in the past, now stable and resolved off therapy as patient's blood pressure management has improved and is under control.  2.  3.  Ophthalmic Meds Ordered this visit:  No orders of the defined types were placed in this encounter.      Return in about 6 months (around 03/21/2021) for DILATE OU, OCT, COLOR FP.  There are no Patient Instructions on file for this visit.   Explained the diagnoses, plan, and follow up with the patient and they expressed understanding.  Patient expressed understanding of the importance of proper follow up care.   Clent Demark Rankin M.D. Diseases & Surgery of the Retina and Vitreous Retina & Diabetic [place] 09/18/20     Abbreviations: M myopia (nearsighted); A astigmatism; H hyperopia (farsighted); P presbyopia; Mrx spectacle prescription;  CTL contact lenses; OD right eye; OS left eye; OU both eyes  XT exotropia; ET esotropia; PEK punctate epithelial keratitis; PEE punctate epithelial erosions; DES dry eye syndrome; MGD meibomian gland dysfunction; ATs artificial tears; PFAT's preservative free artificial tears; [place] nuclear sclerotic cataract; PSC posterior subcapsular cataract; ERM epi-retinal membrane; PVD posterior vitreous detachment; RD retinal detachment; DM diabetes mellitus; DR diabetic retinopathy; NPDR non-proliferative diabetic retinopathy; PDR proliferative diabetic retinopathy; CSME clinically significant macular edema; DME diabetic macular edema; dbh dot blot hemorrhages; CWS cotton wool spot; POAG primary open angle glaucoma; C/D cup-to-disc ratio; HVF humphrey visual field; GVF goldmann visual  field; OCT optical coherence tomography; IOP  intraocular pressure; BRVO Branch retinal vein occlusion; CRVO central retinal vein occlusion; CRAO central retinal artery occlusion; BRAO branch retinal artery occlusion; RT retinal tear; SB scleral buckle; PPV pars plana vitrectomy; VH Vitreous hemorrhage; PRP panretinal laser photocoagulation; IVK intravitreal kenalog; VMT vitreomacular traction; MH Macular hole;  NVD neovascularization of the disc; NVE neovascularization elsewhere; AREDS age related eye disease study; ARMD age related macular degeneration; POAG primary open angle glaucoma; EBMD epithelial/anterior basement membrane dystrophy; ACIOL anterior chamber intraocular lens; IOL intraocular lens; PCIOL posterior chamber intraocular lens; Phaco/IOL phacoemulsification with intraocular lens placement; [place] photorefractive keratectomy; LASIK laser assisted in situ keratomileusis; HTN hypertension; DM diabetes mellitus; COPD chronic obstructive pulmonary disease

## 2020-09-18 NOTE — Assessment & Plan Note (Signed)

## 2020-11-02 DIAGNOSIS — Z712 Person consulting for explanation of examination or test findings: Secondary | ICD-10-CM | POA: Diagnosis not present

## 2020-11-02 DIAGNOSIS — E559 Vitamin D deficiency, unspecified: Secondary | ICD-10-CM | POA: Diagnosis not present

## 2020-11-02 DIAGNOSIS — I10 Essential (primary) hypertension: Secondary | ICD-10-CM | POA: Diagnosis not present

## 2020-11-02 DIAGNOSIS — J302 Other seasonal allergic rhinitis: Secondary | ICD-10-CM | POA: Diagnosis not present

## 2020-11-02 DIAGNOSIS — E039 Hypothyroidism, unspecified: Secondary | ICD-10-CM | POA: Diagnosis not present

## 2020-11-02 DIAGNOSIS — E785 Hyperlipidemia, unspecified: Secondary | ICD-10-CM | POA: Diagnosis not present

## 2020-11-07 DIAGNOSIS — J302 Other seasonal allergic rhinitis: Secondary | ICD-10-CM | POA: Diagnosis not present

## 2020-11-07 DIAGNOSIS — E782 Mixed hyperlipidemia: Secondary | ICD-10-CM | POA: Diagnosis not present

## 2020-11-07 DIAGNOSIS — H353132 Nonexudative age-related macular degeneration, bilateral, intermediate dry stage: Secondary | ICD-10-CM | POA: Diagnosis not present

## 2020-11-07 DIAGNOSIS — E559 Vitamin D deficiency, unspecified: Secondary | ICD-10-CM | POA: Diagnosis not present

## 2020-11-07 DIAGNOSIS — M5431 Sciatica, right side: Secondary | ICD-10-CM | POA: Diagnosis not present

## 2020-11-07 DIAGNOSIS — R7301 Impaired fasting glucose: Secondary | ICD-10-CM | POA: Diagnosis not present

## 2020-11-07 DIAGNOSIS — I10 Essential (primary) hypertension: Secondary | ICD-10-CM | POA: Diagnosis not present

## 2020-11-07 DIAGNOSIS — E039 Hypothyroidism, unspecified: Secondary | ICD-10-CM | POA: Diagnosis not present

## 2021-02-08 DIAGNOSIS — L578 Other skin changes due to chronic exposure to nonionizing radiation: Secondary | ICD-10-CM | POA: Diagnosis not present

## 2021-02-08 DIAGNOSIS — I872 Venous insufficiency (chronic) (peripheral): Secondary | ICD-10-CM | POA: Diagnosis not present

## 2021-02-08 DIAGNOSIS — L570 Actinic keratosis: Secondary | ICD-10-CM | POA: Diagnosis not present

## 2021-02-08 DIAGNOSIS — X32XXXD Exposure to sunlight, subsequent encounter: Secondary | ICD-10-CM | POA: Diagnosis not present

## 2021-03-21 DIAGNOSIS — Z23 Encounter for immunization: Secondary | ICD-10-CM | POA: Diagnosis not present

## 2021-03-26 ENCOUNTER — Encounter (INDEPENDENT_AMBULATORY_CARE_PROVIDER_SITE_OTHER): Payer: Medicare Other | Admitting: Ophthalmology

## 2021-03-26 DIAGNOSIS — Z23 Encounter for immunization: Secondary | ICD-10-CM | POA: Diagnosis not present

## 2021-04-09 ENCOUNTER — Encounter (INDEPENDENT_AMBULATORY_CARE_PROVIDER_SITE_OTHER): Payer: Self-pay | Admitting: Ophthalmology

## 2021-04-09 ENCOUNTER — Other Ambulatory Visit: Payer: Self-pay

## 2021-04-09 ENCOUNTER — Ambulatory Visit (INDEPENDENT_AMBULATORY_CARE_PROVIDER_SITE_OTHER): Payer: Medicare Other | Admitting: Ophthalmology

## 2021-04-09 DIAGNOSIS — H34832 Tributary (branch) retinal vein occlusion, left eye, with macular edema: Secondary | ICD-10-CM | POA: Diagnosis not present

## 2021-04-09 DIAGNOSIS — H353132 Nonexudative age-related macular degeneration, bilateral, intermediate dry stage: Secondary | ICD-10-CM | POA: Diagnosis not present

## 2021-04-09 DIAGNOSIS — H2513 Age-related nuclear cataract, bilateral: Secondary | ICD-10-CM

## 2021-04-09 NOTE — Assessment & Plan Note (Signed)
Progressive nuclear sclerotic cataracts are present in each eye.  Patient retains excellent visual acuity and she reports no nighttime visual difficulties.  We will continue to monitor and send patient for evaluation cataract surgery when symptoms are appropriately impacting her life

## 2021-04-09 NOTE — Assessment & Plan Note (Addendum)
Non-Center involved CME OS, will continue to observe

## 2021-04-09 NOTE — Assessment & Plan Note (Signed)
No signs of wet AMD OU

## 2021-04-09 NOTE — Progress Notes (Signed)
04/09/2021     CHIEF COMPLAINT Patient presents for  Chief Complaint  Patient presents with   Retina Follow Up    6 Mo F/U OU  Pt denies noticeable changes to [place] OU since last visit. Pt denies ocular pain, flashes of light, or floaters OU.         HISTORY OF PRESENT ILLNESS: Carrie Osborne is a 80 y.o. female who presents to the clinic today for:   HPI     Retina Follow Up   Patient presents with  Dry AMD.  In right eye.  This started 6 months ago.  Severity is mild.  Duration of 6 months.  Since onset it is stable. Additional comments: 6 Mo F/U OU  Pt denies noticeable changes to [place] OU since last visit. Pt denies ocular pain, flashes of light, or floaters OU.           Comments   6 mos fu OU OCT. Patient states vision is stable and unchanged since last visit. Denies any new floaters or FOL. Pt states she has flashes of light on the side of her eye, in colors, she states she has had the flashes and they are not new.       Last edited by Laurin Coder on 04/09/2021 10:21 AM.      Referring physician: Celene Squibb, MD [address],  [place] [address]  HISTORICAL INFORMATION:   Selected notes from the [facility]: No current outpatient medications on file. (Ophthalmic Drugs)   No current facility-administered medications for this visit. (Ophthalmic Drugs)   Current Outpatient Medications (Other)  Medication Sig   Calcium Carbonate-Vitamin D (CALCIUM-VITAMIN D3 PO) Take 1 tablet by mouth 2 (two) times daily.   cetirizine (ZYRTEC) 10 MG tablet Take 10 mg by mouth daily.   Coenzyme Q10 (CO Q-10) 100 MG CAPS Take 100 mg by mouth daily.    hydrochlorothiazide (HYDRODIURIL) 25 MG tablet Take 1 tablet by mouth once daily   levothyroxine (SYNTHROID) 75 MCG tablet Take 1 tablet (75 mcg total) by mouth daily before breakfast.   lisinopril (ZESTRIL) 10 MG tablet Take 10 mg by mouth daily.   metoprolol  succinate (TOPROL-XL) 100 MG 24 hr tablet Take 100 mg by mouth daily. Take with or immediately following a meal.   mometasone (NASONEX) 50 MCG/ACT nasal spray Place 1-2 sprays into the nose daily as needed (for allergies).    Multiple Vitamins-Minerals (CENTRUM SILVER PO) Take 1 tablet by mouth daily.   Na Sulfate-K Sulfate-Mg Sulf 17.5-3.13-1.6 GM/177ML SOLN Take 1 kit by mouth as directed.   Omega-3 Fatty Acids (FISH OIL) 1200 MG CAPS Take 1,200 mg by mouth daily.    simvastatin (ZOCOR) 40 MG tablet Take 40 mg by mouth at bedtime.    No current facility-administered medications for this visit. (Other)      REVIEW OF SYSTEMS:    ALLERGIES Allergies  Allergen Reactions   Penicillins Hives, Itching, Rash and Other (See Comments)    Has patient had a PCN reaction causing immediate rash, facial/tongue/throat swelling, SOB or lightheadedness with hypotension: No Has patient had a PCN reaction causing severe rash involving mucus membranes or skin necrosis: No Has patient had a PCN reaction that required hospitalization: No Has patient had a PCN reaction occurring within the last 10 years: Yes If all of the above answers are "NO", then may proceed with Cephalosporin use.  PAST MEDICAL HISTORY Past Medical History:  Diagnosis Date   Arthritis    Baker's cyst    Hypothyroidism    Osteoarthritis    Past Surgical History:  Procedure Laterality Date   ABDOMINAL HYSTERECTOMY     COLONOSCOPY N/A 07/04/2017   Procedure: COLONOSCOPY;  Surgeon: Corbin Ade, MD;  [facility];  Service: Endoscopy;  Laterality: N/A;  12:45pm   TONSILLECTOMY      FAMILY HISTORY Family History  Problem Relation Age of Onset   Colon cancer Neg Hx     SOCIAL HISTORY Social History   Tobacco Use   Smoking status: Never   Smokeless tobacco: Never  Vaping Use   Vaping Use: Never used  Substance Use Topics   Alcohol use: No   Drug use: No         OPHTHALMIC EXAM:  Base Eye  Exam     Visual Acuity (ETDRS)       Right Left   Dist cc 20/20 20/25 -2    Correction: Glasses         Tonometry (Tonopen, 10:19 AM)       Right Left   Pressure 15 16         Pupils       Pupils Dark Light Shape React APD   Right PERRL 4 3 Round Brisk None   Left PERRL 4 3 Round Brisk None         Visual Fields (Counting fingers)       Left Right     Full   Restrictions Partial outer inferior temporal deficiency          Extraocular Movement       Right Left    Full Full         Neuro/Psych     Oriented x3: Yes   Mood/Affect: Normal         Dilation     Both eyes: 1.0% Mydriacyl, 2.5% Phenylephrine @ 10:19 AM           Slit Lamp and Fundus Exam     External Exam       Right Left   External Normal Normal         Slit Lamp Exam       Right Left   Lids/Lashes Normal Normal   Conjunctiva/Sclera White and quiet White and quiet   Cornea Clear Clear   Anterior Chamber Deep and quiet Deep and quiet   Iris Round and reactive Round and reactive   Lens 3+ Nuclear sclerosis 3+ Nuclear sclerosis   Anterior Vitreous Normal Normal         Fundus Exam       Right Left   Posterior Vitreous Posterior vitreous detachment Posterior vitreous detachment old central vitreous floaters   Disc Normal Normal   C/D Ratio 0.55 0.55   Macula Microaneurysms, no macular thickening, Soft drusen, no exudates, no hemorrhage Microaneurysms, no macular thickening, Soft drusen, no exudates, no hemorrhage   Vessels Normal Old branch retinal vein occlusion, with minor CME temporal to the fovea, not in the FAZ, no N/V   Periphery Normal Normal            IMAGING AND PROCEDURES  Imaging and Procedures for 04/09/21  OCT, Retina - OU - Both Eyes       Right Eye Quality was good. Scan locations included subfoveal. Central Foveal Thickness: 280. Progression has been stable. Findings include retinal drusen .  Left Eye Quality was good. Scan  locations included subfoveal. Central Foveal Thickness: 265. Findings include cystoid macular edema, retinal drusen , no IRF.   Notes OD, no active maculopathy, OS with chronic ongoing CME temporal to the fovea, mostly stable currently at 58-month interval will observe             ASSESSMENT/PLAN:  Intermediate stage nonexudative age-related macular degeneration of both eyes No signs of wet AMD OU  Branch retinal vein occlusion with macular edema of left eye Non-Center involved CME OS, will continue to observe  Cataract, nuclear sclerotic, both eyes Progressive nuclear sclerotic cataracts are present in each eye.  Patient retains excellent visual acuity and she reports no nighttime visual difficulties.  We will continue to monitor and send patient for evaluation cataract surgery when symptoms are appropriately impacting her life     ICD-10-CM   1. Intermediate stage nonexudative age-related macular degeneration of both eyes  H35.3132 OCT, Retina - OU - Both Eyes    2. Branch retinal vein occlusion with macular edema of left eye  H34.8320     3. Cataract, nuclear sclerotic, both eyes  H25.13       1.  OS still stable BRVO with CME with no impact on central visual acuity.  Stable at 76-month follow-up.  2.  OU with intermediate ARMD no progression to wet AMD OU follow-up in 6 months or as needed new visual acuity decline or distortion  3.  OU with nuclear sclerotic cataract progressing likely to need something done surgically within the next 1 to 2 years.  Ophthalmic Meds Ordered this visit:  No orders of the defined types were placed in this encounter.      No follow-ups on file.  There are no Patient Instructions on file for this visit.   Explained the diagnoses, plan, and follow up with the patient and they expressed understanding.  Patient expressed understanding of the importance of proper follow up care.   Clent Demark Malijah Lietz M.D. Diseases & Surgery of the Retina and  Vitreous Retina & Diabetic [place] 04/09/21     Abbreviations: M myopia (nearsighted); A astigmatism; H hyperopia (farsighted); P presbyopia; Mrx spectacle prescription;  CTL contact lenses; OD right eye; OS left eye; OU both eyes  XT exotropia; ET esotropia; PEK punctate epithelial keratitis; PEE punctate epithelial erosions; DES dry eye syndrome; MGD meibomian gland dysfunction; ATs artificial tears; PFAT's preservative free artificial tears; [place] nuclear sclerotic cataract; PSC posterior subcapsular cataract; ERM epi-retinal membrane; PVD posterior vitreous detachment; RD retinal detachment; DM diabetes mellitus; DR diabetic retinopathy; NPDR non-proliferative diabetic retinopathy; PDR proliferative diabetic retinopathy; CSME clinically significant macular edema; DME diabetic macular edema; dbh dot blot hemorrhages; CWS cotton wool spot; POAG primary open angle glaucoma; C/D cup-to-disc ratio; HVF humphrey visual field; GVF goldmann visual field; OCT optical coherence tomography; IOP intraocular pressure; BRVO Branch retinal vein occlusion; CRVO central retinal vein occlusion; CRAO central retinal artery occlusion; BRAO branch retinal artery occlusion; RT retinal tear; SB scleral buckle; PPV pars plana vitrectomy; VH Vitreous hemorrhage; PRP panretinal laser photocoagulation; IVK intravitreal kenalog; VMT vitreomacular traction; MH Macular hole;  NVD neovascularization of the disc; NVE neovascularization elsewhere; AREDS age related eye disease study; ARMD age related macular degeneration; POAG primary open angle glaucoma; EBMD epithelial/anterior basement membrane dystrophy; ACIOL anterior chamber intraocular lens; IOL intraocular lens; PCIOL posterior chamber intraocular lens; Phaco/IOL phacoemulsification with intraocular lens placement; [place] photorefractive keratectomy; LASIK laser assisted in situ keratomileusis; HTN hypertension; DM diabetes mellitus;  COPD chronic obstructive pulmonary disease

## 2021-05-07 DIAGNOSIS — R7301 Impaired fasting glucose: Secondary | ICD-10-CM | POA: Diagnosis not present

## 2021-05-07 DIAGNOSIS — E559 Vitamin D deficiency, unspecified: Secondary | ICD-10-CM | POA: Diagnosis not present

## 2021-05-07 DIAGNOSIS — E039 Hypothyroidism, unspecified: Secondary | ICD-10-CM | POA: Diagnosis not present

## 2021-05-10 DIAGNOSIS — M5431 Sciatica, right side: Secondary | ICD-10-CM | POA: Diagnosis not present

## 2021-05-10 DIAGNOSIS — J302 Other seasonal allergic rhinitis: Secondary | ICD-10-CM | POA: Diagnosis not present

## 2021-05-10 DIAGNOSIS — E559 Vitamin D deficiency, unspecified: Secondary | ICD-10-CM | POA: Diagnosis not present

## 2021-05-10 DIAGNOSIS — H353132 Nonexudative age-related macular degeneration, bilateral, intermediate dry stage: Secondary | ICD-10-CM | POA: Diagnosis not present

## 2021-05-10 DIAGNOSIS — R7301 Impaired fasting glucose: Secondary | ICD-10-CM | POA: Diagnosis not present

## 2021-05-10 DIAGNOSIS — H7292 Unspecified perforation of tympanic membrane, left ear: Secondary | ICD-10-CM | POA: Diagnosis not present

## 2021-05-10 DIAGNOSIS — Z0001 Encounter for general adult medical examination with abnormal findings: Secondary | ICD-10-CM | POA: Diagnosis not present

## 2021-05-10 DIAGNOSIS — E039 Hypothyroidism, unspecified: Secondary | ICD-10-CM | POA: Diagnosis not present

## 2021-05-10 DIAGNOSIS — I10 Essential (primary) hypertension: Secondary | ICD-10-CM | POA: Diagnosis not present

## 2021-05-10 DIAGNOSIS — E782 Mixed hyperlipidemia: Secondary | ICD-10-CM | POA: Diagnosis not present

## 2021-06-20 DIAGNOSIS — R350 Frequency of micturition: Secondary | ICD-10-CM | POA: Diagnosis not present

## 2021-07-23 ENCOUNTER — Telehealth (INDEPENDENT_AMBULATORY_CARE_PROVIDER_SITE_OTHER): Payer: Self-pay

## 2021-07-23 ENCOUNTER — Other Ambulatory Visit: Payer: Self-pay

## 2021-07-23 ENCOUNTER — Ambulatory Visit (INDEPENDENT_AMBULATORY_CARE_PROVIDER_SITE_OTHER): Payer: Medicare Other | Admitting: Ophthalmology

## 2021-07-23 ENCOUNTER — Encounter (INDEPENDENT_AMBULATORY_CARE_PROVIDER_SITE_OTHER): Payer: Self-pay | Admitting: Ophthalmology

## 2021-07-23 DIAGNOSIS — H353132 Nonexudative age-related macular degeneration, bilateral, intermediate dry stage: Secondary | ICD-10-CM

## 2021-07-23 DIAGNOSIS — H353221 Exudative age-related macular degeneration, left eye, with active choroidal neovascularization: Secondary | ICD-10-CM | POA: Diagnosis not present

## 2021-07-23 DIAGNOSIS — H34832 Tributary (branch) retinal vein occlusion, left eye, with macular edema: Secondary | ICD-10-CM

## 2021-07-23 MED ORDER — BEVACIZUMAB 2.5 MG/0.1ML IZ SOSY
2.5000 mg | PREFILLED_SYRINGE | INTRAVITREAL | Status: AC | PRN
  Administered 2021-07-23: 2.5 mg via INTRAVITREAL

## 2021-07-23 NOTE — Assessment & Plan Note (Signed)
OS with new subfoveal pigment epithelial detachment wet ARMD in the region of prior macular edema from Ischemic BRVO.  I explained the patient that therapy is the same using antivegF we will commence today and follow-up in 1 month and repeat evaluation and likely injection again

## 2021-07-23 NOTE — Assessment & Plan Note (Signed)
OD stable no active CNVM

## 2021-07-23 NOTE — Progress Notes (Signed)
07/23/2021     CHIEF COMPLAINT Patient presents for  Chief Complaint  Patient presents with   Retina Follow Up      HISTORY OF PRESENT ILLNESS: Carrie Osborne is a 81 y.o. female who presents to the clinic today for:   HPI     Retina Follow Up           Diagnosis: Other   Laterality: left eye   Onset: 2 weeks ago   Severity: mild   Duration: 2 weeks   Course: gradually worsening         Comments   WIP- Decrease in vision OS-   Pt states, "I had an episode about 2 weeks ago where a black curtain came over my eye and I couldn't see anything and then it cleared up. Now when I am laying down in my bed I cannot see my clock with my left eye. It is just dark. During the day my vision is better in my left eye but it is still very blurred and when it gets dark I cannot see even light out of my left eye."        Last edited by Demetrios Loll, COA on 07/23/2021  1:07 PM.      Referring physician: Benita Stabile, MD [address] [place],  [place] [address]  HISTORICAL INFORMATION:   Selected notes from the MEDICAL RECORD NUMBER       CURRENT MEDICATIONS: No current outpatient medications on file. (Ophthalmic Drugs)   No current facility-administered medications for this visit. (Ophthalmic Drugs)   Current Outpatient Medications (Other)  Medication Sig   Calcium Carbonate-Vitamin D (CALCIUM-VITAMIN D3 PO) Take 1 tablet by mouth 2 (two) times daily.   cetirizine (ZYRTEC) 10 MG tablet Take 10 mg by mouth daily.   Coenzyme Q10 (CO Q-10) 100 MG CAPS Take 100 mg by mouth daily.    hydrochlorothiazide (HYDRODIURIL) 25 MG tablet Take 1 tablet by mouth once daily   levothyroxine (SYNTHROID) 75 MCG tablet Take 1 tablet (75 mcg total) by mouth daily before breakfast.   lisinopril (ZESTRIL) 10 MG tablet Take 10 mg by mouth daily.   metoprolol succinate (TOPROL-XL) 100 MG 24 hr tablet Take 100 mg by mouth daily. Take with or immediately following a meal.   mometasone  (NASONEX) 50 MCG/ACT nasal spray Place 1-2 sprays into the nose daily as needed (for allergies).    Multiple Vitamins-Minerals (CENTRUM SILVER PO) Take 1 tablet by mouth daily.   Na Sulfate-K Sulfate-Mg Sulf 17.5-3.13-1.6 GM/177ML SOLN Take 1 kit by mouth as directed.   Omega-3 Fatty Acids (FISH OIL) 1200 MG CAPS Take 1,200 mg by mouth daily.    simvastatin (ZOCOR) 40 MG tablet Take 40 mg by mouth at bedtime.    No current facility-administered medications for this visit. (Other)      REVIEW OF SYSTEMS:    ALLERGIES Allergies  Allergen Reactions   Penicillins Hives, Itching, Rash and Other (See Comments)    Has patient had a PCN reaction causing immediate rash, facial/tongue/throat swelling, SOB or lightheadedness with hypotension: No Has patient had a PCN reaction causing severe rash involving mucus membranes or skin necrosis: No Has patient had a PCN reaction that required hospitalization: No Has patient had a PCN reaction occurring within the last 10 years: Yes If all of the above answers are "NO", then may proceed with Cephalosporin use.     PAST MEDICAL HISTORY Past Medical History:  Diagnosis Date  Arthritis    Baker's cyst    Hypothyroidism    Osteoarthritis    Past Surgical History:  Procedure Laterality Date   ABDOMINAL HYSTERECTOMY     COLONOSCOPY N/A 07/04/2017   Procedure: COLONOSCOPY;  Surgeon: Daneil Dolin, MD;  Location: [facility];  Service: Endoscopy;  Laterality: N/A;  12:45pm   TONSILLECTOMY      FAMILY HISTORY Family History  Problem Relation Age of Onset   Colon cancer Neg Hx     SOCIAL HISTORY Social History   Tobacco Use   Smoking status: Never   Smokeless tobacco: Never  Vaping Use   Vaping Use: Never used  Substance Use Topics   Alcohol use: No   Drug use: No         OPHTHALMIC EXAM:  Base Eye Exam     Visual Acuity (ETDRS)       Right Left   Dist cc 20/20 20/100 -1   Dist ph cc  NI    Correction: Glasses          Tonometry (Tonopen, 1:10 PM)       Right Left   Pressure 16 19         Pupils       Pupils Dark Light Shape React APD   Right PERRL 4 3 Round Brisk None   Left PERRL 4 3 Round Brisk None         Neuro/Psych     Oriented x3: Yes   Mood/Affect: Normal         Dilation     Left eye: 1.0% Mydriacyl, 2.5% Phenylephrine @ 1:09 PM           Slit Lamp and Fundus Exam     External Exam       Right Left   External Normal Normal         Slit Lamp Exam       Right Left   Lids/Lashes Normal Normal   Conjunctiva/Sclera White and quiet White and quiet   Cornea Clear Clear   Anterior Chamber Deep and quiet Deep and quiet   Iris Round and reactive Round and reactive   Lens 3+ Nuclear sclerosis 3+ Nuclear sclerosis   Anterior Vitreous Normal Normal         Fundus Exam       Right Left   Posterior Vitreous Posterior vitreous detachment Posterior vitreous detachment old central vitreous floaters   Disc Normal Normal   C/D Ratio 0.55 0.55   Macula Microaneurysms, no macular thickening, Soft drusen, no exudates, no hemorrhage Microaneurysms, no macular thickening, Soft drusen, no exudates, no hemorrhage   Vessels Normal Old branch retinal vein occlusion, with minor CME temporal to the fovea, not in the FAZ, no N/V   Periphery Normal Normal            IMAGING AND PROCEDURES  Imaging and Procedures for 07/23/21  OCT, Retina - OU - Both Eyes       Right Eye Quality was good. Scan locations included subfoveal. Central Foveal Thickness: 283. Progression has been stable. Findings include retinal drusen .   Left Eye Quality was good. Scan locations included subfoveal. Central Foveal Thickness: 713. Findings include cystoid macular edema, retinal drusen , no IRF, subretinal hyper-reflective material, subretinal fluid, intraretinal fluid, pigment epithelial detachment.   Notes OD, no active maculopathy,  OS with history of macular BRVO, with new wet  ARMD OS today.  With subfoveal pigment epithelial detachment large  Color Fundus Photography Optos - OU - Both Eyes       Right Eye Progression has been stable. Vessels : normal observations. Periphery : normal observations.   Left Eye Progression has been stable. Disc findings include normal observations. Macula : microaneurysms, retinal pigment epithelium abnormalities, exudates, edema. Periphery : normal observations.   Notes OD with incidental PVD.,  Quiescent BRVO ST distally OS.  Today with new subfoveal green subfoveal lesion and an area of prior Macular BRVO.  OU with history of BRVO now stable     Intravitreal Injection, Pharmacologic Agent - OS - Left Eye       Time Out 07/23/2021. 1:42 PM. Confirmed correct patient, procedure, site, and patient consented.   Anesthesia Topical anesthesia was used. Anesthetic medications included Lidocaine 4%.   Procedure Preparation included 10% betadine to eyelids, Tobramycin 0.3%. A 30 gauge needle was used.   Injection: 2.5 mg bevacizumab 2.5 MG/0.1ML   Route: Intravitreal, Site: Left Eye   NDC: [phone], Lot: [id number]   Post-op Post injection exam found visual acuity of at least counting fingers. The patient tolerated the procedure well. There were no complications. The patient received written and verbal post procedure care education. Post injection medications included ocuflox.              ASSESSMENT/PLAN:  Exudative age-related macular degeneration of left eye with active choroidal neovascularization (HCC) OS with new subfoveal pigment epithelial detachment wet ARMD in the region of prior macular edema from Ischemic BRVO.  I explained the patient that therapy is the same using antivegF we will commence today and follow-up in 1 month and repeat evaluation and likely injection again  Intermediate stage nonexudative age-related macular degeneration of both eyes OD stable no active CNVM     ICD-10-CM    1. Exudative age-related macular degeneration of left eye with active choroidal neovascularization (HCC)  H35.3221 Intravitreal Injection, Pharmacologic Agent - OS - Left Eye    bevacizumab (AVASTIN) SOSY 2.5 mg    2. Intermediate stage nonexudative age-related macular degeneration of both eyes  H35.3132     3. Branch retinal vein occlusion with macular edema of left eye  H34.8320 OCT, Retina - OU - Both Eyes    Color Fundus Photography Optos - OU - Both Eyes      1.  OS with new onset vision loss companied by clinical as well as OCT findings of subfoveal hemorrhagic PED, wet AMD, complicating a region of CME from prior BRVO.  2.  OD with dry ARMD, no sign of CNVM  3.  Ophthalmic Meds Ordered this visit:  Meds ordered this encounter  Medications   bevacizumab (AVASTIN) SOSY 2.5 mg       Return in about 1 month (around 08/21/2021) for dilate, OS, AVASTIN OCT.  There are no Patient Instructions on file for this visit.   Explained the diagnoses, plan, and follow up with the patient and they expressed understanding.  Patient expressed understanding of the importance of proper follow up care.   Clent Demark Xion Debruyne M.D. Diseases & Surgery of the Retina and Vitreous Retina & Diabetic [place] 07/23/21     Abbreviations: M myopia (nearsighted); A astigmatism; H hyperopia (farsighted); P presbyopia; Mrx spectacle prescription;  CTL contact lenses; OD right eye; OS left eye; OU both eyes  XT exotropia; ET esotropia; PEK punctate epithelial keratitis; PEE punctate epithelial erosions; DES dry eye syndrome; MGD meibomian gland dysfunction; ATs artificial tears; PFAT's preservative free artificial tears; [place] nuclear sclerotic  cataract; PSC posterior subcapsular cataract; ERM epi-retinal membrane; PVD posterior vitreous detachment; RD retinal detachment; DM diabetes mellitus; DR diabetic retinopathy; NPDR non-proliferative diabetic retinopathy; PDR proliferative diabetic retinopathy; CSME  clinically significant macular edema; DME diabetic macular edema; dbh dot blot hemorrhages; CWS cotton wool spot; POAG primary open angle glaucoma; C/D cup-to-disc ratio; HVF humphrey visual field; GVF goldmann visual field; OCT optical coherence tomography; IOP intraocular pressure; BRVO Branch retinal vein occlusion; CRVO central retinal vein occlusion; CRAO central retinal artery occlusion; BRAO branch retinal artery occlusion; RT retinal tear; SB scleral buckle; PPV pars plana vitrectomy; VH Vitreous hemorrhage; PRP panretinal laser photocoagulation; IVK intravitreal kenalog; VMT vitreomacular traction; MH Macular hole;  NVD neovascularization of the disc; NVE neovascularization elsewhere; AREDS age related eye disease study; ARMD age related macular degeneration; POAG primary open angle glaucoma; EBMD epithelial/anterior basement membrane dystrophy; ACIOL anterior chamber intraocular lens; IOL intraocular lens; PCIOL posterior chamber intraocular lens; Phaco/IOL phacoemulsification with intraocular lens placement; [place] photorefractive keratectomy; LASIK laser assisted in situ keratomileusis; HTN hypertension; DM diabetes mellitus; COPD chronic obstructive pulmonary disease

## 2021-08-21 ENCOUNTER — Ambulatory Visit (INDEPENDENT_AMBULATORY_CARE_PROVIDER_SITE_OTHER): Payer: Medicare Other | Admitting: Ophthalmology

## 2021-08-21 ENCOUNTER — Other Ambulatory Visit: Payer: Self-pay

## 2021-08-21 ENCOUNTER — Encounter (INDEPENDENT_AMBULATORY_CARE_PROVIDER_SITE_OTHER): Payer: Self-pay | Admitting: Ophthalmology

## 2021-08-21 DIAGNOSIS — H353221 Exudative age-related macular degeneration, left eye, with active choroidal neovascularization: Secondary | ICD-10-CM

## 2021-08-21 DIAGNOSIS — H34832 Tributary (branch) retinal vein occlusion, left eye, with macular edema: Secondary | ICD-10-CM | POA: Diagnosis not present

## 2021-08-21 DIAGNOSIS — H35722 Serous detachment of retinal pigment epithelium, left eye: Secondary | ICD-10-CM | POA: Diagnosis not present

## 2021-08-21 DIAGNOSIS — H2513 Age-related nuclear cataract, bilateral: Secondary | ICD-10-CM

## 2021-08-21 MED ORDER — BEVACIZUMAB 2.5 MG/0.1ML IZ SOSY
2.5000 mg | PREFILLED_SYRINGE | INTRAVITREAL | Status: AC | PRN
  Administered 2021-08-21: 2.5 mg via INTRAVITREAL

## 2021-08-21 NOTE — Assessment & Plan Note (Signed)

## 2021-08-21 NOTE — Assessment & Plan Note (Signed)
Improved acuity from 20/100 now to 20/60 postinjection #1 Avastin, concomitant with much less subretinal fluid as well as an RPE rip superiorly seen clinically

## 2021-08-21 NOTE — Assessment & Plan Note (Signed)
Avastin No. 24 June 2021,  Resolved as component of wet AMD with an RPE rip superiorly

## 2021-08-21 NOTE — Progress Notes (Signed)
08/21/2021     CHIEF COMPLAINT Patient presents for  Chief Complaint  Patient presents with   Macular Degeneration      HISTORY OF PRESENT ILLNESS: Carrie Osborne is a 81 y.o. female who presents to the clinic today for:   HPI   4 weeks (1 mo) dilate OS, Avastin OS, oct. Patient states vision is stable and unchanged since last visit. Denies any new floaters or FOL. Able to read eye chart today Last edited by Hurman Horn, MD on 08/21/2021  3:05 PM.      Referring physician: Celene Squibb, MD [place],  [place] [address]  HISTORICAL INFORMATION:   Selected notes from the [facility]: No current outpatient medications on file. (Ophthalmic Drugs)   No current facility-administered medications for this visit. (Ophthalmic Drugs)   Current Outpatient Medications (Other)  Medication Sig   Calcium Carbonate-Vitamin D (CALCIUM-VITAMIN D3 PO) Take 1 tablet by mouth 2 (two) times daily.   cetirizine (ZYRTEC) 10 MG tablet Take 10 mg by mouth daily.   Coenzyme Q10 (CO Q-10) 100 MG CAPS Take 100 mg by mouth daily.    hydrochlorothiazide (HYDRODIURIL) 25 MG tablet Take 1 tablet by mouth once daily   levothyroxine (SYNTHROID) 75 MCG tablet Take 1 tablet (75 mcg total) by mouth daily before breakfast.   lisinopril (ZESTRIL) 10 MG tablet Take 10 mg by mouth daily.   metoprolol succinate (TOPROL-XL) 100 MG 24 hr tablet Take 100 mg by mouth daily. Take with or immediately following a meal.   mometasone (NASONEX) 50 MCG/ACT nasal spray Place 1-2 sprays into the nose daily as needed (for allergies).    Multiple Vitamins-Minerals (CENTRUM SILVER PO) Take 1 tablet by mouth daily.   Na Sulfate-K Sulfate-Mg Sulf 17.5-3.13-1.6 GM/177ML SOLN Take 1 kit by mouth as directed.   Omega-3 Fatty Acids (FISH OIL) 1200 MG CAPS Take 1,200 mg by mouth daily.    simvastatin (ZOCOR) 40 MG tablet Take 40 mg by mouth at bedtime.    No current  facility-administered medications for this visit. (Other)      REVIEW OF SYSTEMS: ROS   Negative for: Constitutional, Gastrointestinal, Neurological, Skin, Genitourinary, Musculoskeletal, HENT, Endocrine, Cardiovascular, Eyes, Respiratory, Psychiatric, Allergic/Imm, Heme/Lymph Last edited by Hurman Horn, MD on 08/21/2021  3:03 PM.       ALLERGIES Allergies  Allergen Reactions   Penicillins Hives, Itching, Rash and Other (See Comments)    Has patient had a PCN reaction causing immediate rash, facial/tongue/throat swelling, SOB or lightheadedness with hypotension: No Has patient had a PCN reaction causing severe rash involving mucus membranes or skin necrosis: No Has patient had a PCN reaction that required hospitalization: No Has patient had a PCN reaction occurring within the last 10 years: Yes If all of the above answers are "NO", then may proceed with Cephalosporin use.     PAST MEDICAL HISTORY Past Medical History:  Diagnosis Date   Arthritis    Baker's cyst    Hypothyroidism    Osteoarthritis    Past Surgical History:  Procedure Laterality Date   ABDOMINAL HYSTERECTOMY     COLONOSCOPY N/A 07/04/2017   Procedure: COLONOSCOPY;  Surgeon: Daneil Dolin, MD;  [facility];  Service: Endoscopy;  Laterality: N/A;  12:45pm   TONSILLECTOMY      FAMILY HISTORY Family History  Problem Relation Age of Onset   Colon cancer Neg Hx  SOCIAL HISTORY Social History   Tobacco Use   Smoking status: Never   Smokeless tobacco: Never  Vaping Use   Vaping Use: Never used  Substance Use Topics   Alcohol use: No   Drug use: No         OPHTHALMIC EXAM:  Base Eye Exam     Visual Acuity (ETDRS)       Right Left   Dist cc 20/20 20/60 -1   Dist ph cc  NI    Correction: Glasses         Tonometry (Tonopen, 2:32 PM)       Right Left   Pressure 17 14         Pupils       Pupils Dark Light APD   Right PERRL 4 3 None   Left PERRL 4 3 None          Visual Fields       Left Right    Full Full         Extraocular Movement       Right Left    Full Full         Neuro/Psych     Oriented x3: Yes   Mood/Affect: Normal         Dilation     Left eye: 1.0% Mydriacyl, 2.5% Phenylephrine @ 2:32 PM           Slit Lamp and Fundus Exam     External Exam       Right Left   External Normal Normal         Slit Lamp Exam       Right Left   Lids/Lashes Normal Normal   Conjunctiva/Sclera White and quiet White and quiet   Cornea Clear Clear   Anterior Chamber Deep and quiet Deep and quiet   Iris Round and reactive Round and reactive   Lens 3+ Nuclear sclerosis 3+ Nuclear sclerosis   Anterior Vitreous Normal Normal         Fundus Exam       Right Left   Posterior Vitreous Posterior vitreous detachment Posterior vitreous detachment old central vitreous floaters   Disc Normal Normal   C/D Ratio 0.55 0.55   Macula Microaneurysms, no macular thickening, Soft drusen, no exudates, no hemorrhage Microaneurysms, no macular thickening, Soft drusen, no exudates, no hemorrhage   Vessels Normal Old branch retinal vein occlusion, with minor CME temporal to the fovea, not in the FAZ, no N/V   Periphery Normal Normal            IMAGING AND PROCEDURES  Imaging and Procedures for 08/21/21  OCT, Retina - OU - Both Eyes       Right Eye Quality was good. Scan locations included subfoveal. Central Foveal Thickness: 283. Progression has been stable. Findings include retinal drusen .   Left Eye Quality was good. Scan locations included subfoveal. Central Foveal Thickness: 292. Findings include cystoid macular edema, retinal drusen , subretinal hyper-reflective material, subretinal fluid, intraretinal fluid, pigment epithelial detachment.   Notes OD, no active maculopathy,  OS with history of macular BRVO postinjection #1 for large subfoveal PED, now with evidence of an RPE rip, with RPE change and atrophy  superior, and scrolled RPE subfoveal, much less subretinal fluid and much less intraretinal fluid, but in good vision OS       Intravitreal Injection, Pharmacologic Agent - OS - Left Eye       Time Out 08/21/2021.  3:06 PM. Confirmed correct patient, procedure, site, and patient consented.   Anesthesia Topical anesthesia was used. Anesthetic medications included Lidocaine 4%.   Procedure Preparation included 10% betadine to eyelids, Tobramycin 0.3%. A 30 gauge needle was used.   Injection: 2.5 mg bevacizumab 2.5 MG/0.1ML   Route: Intravitreal, Site: Left Eye   NDC: [phone], Lot: [id number]   Post-op Post injection exam found visual acuity of at least counting fingers. The patient tolerated the procedure well. There were no complications. The patient received written and verbal post procedure care education. Post injection medications included ocuflox.              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 24 June 2021,  Resolved as component of wet AMD with an RPE rip superiorly  Exudative age-related macular degeneration of left eye with active choroidal neovascularization ([place]) Improved acuity from 20/100 now to 20/60 postinjection #1 Avastin, concomitant with much less subretinal fluid as well as an RPE rip superiorly seen clinically  Cataract, nuclear sclerotic, both eyes The nature of cataract was discussed with the patient as well as the elective nature of surgery. The patient was reassured that surgery at a later date does not put the patient at risk for a worse outcome. It was emphasized that the need for surgery is dictated by the patient's quality of life as influenced by the cataract. Patient was instructed to maintain close follow up with their general eye care doctor.     ICD-10-CM   1. Branch retinal vein occlusion with macular edema of left eye  H34.8320 OCT, Retina - OU - Both Eyes    Intravitreal Injection,  Pharmacologic Agent - OS - Left Eye    bevacizumab (AVASTIN) SOSY 2.5 mg    2. 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 Exudative age-related macular degeneration of left eye with active choroidal neovascularization ([place])  H35.3221     4. Cataract, nuclear sclerotic, both eyes  H25.13       1.  OD no sign of CNVM by OCT  2.  OS, much improved acuity and macular findings by exam and OCT post injection of 1 Avastin.  Concomitant with incidental RPE rip, which in my experience typically associates with a more favorable experience if the foveal region is preserved  3.  Repeat injection OS today   4.  Okay to proceed with cath extraction with intraocular displacement both eyes at any time  Ophthalmic Meds Ordered this visit:  Meds ordered this encounter  Medications   bevacizumab (AVASTIN) SOSY 2.5 mg       Return in about 5 weeks (around 09/25/2021) for dilate, OS, AVASTIN OCT.  There are no Patient Instructions on file for this visit.   Explained the diagnoses, plan, and follow up with the patient and they expressed understanding.  Patient expressed understanding of the importance of proper follow up care.   Clent Demark Kendarrius Tanzi M.D. Diseases & Surgery of the Retina and Vitreous Retina & Diabetic [facility] 08/21/21     Abbreviations: M myopia (nearsighted); A astigmatism; H hyperopia (farsighted); P presbyopia; Mrx spectacle prescription;  CTL contact lenses; OD right eye; OS left eye; OU both eyes  XT exotropia; ET esotropia; PEK punctate epithelial keratitis; PEE punctate epithelial erosions; DES dry eye syndrome; MGD meibomian gland dysfunction; ATs artificial tears; PFAT's preservative free artificial tears; [place] nuclear sclerotic cataract; PSC posterior subcapsular cataract; ERM epi-retinal membrane; PVD posterior vitreous  detachment; RD retinal detachment; DM diabetes mellitus; DR diabetic retinopathy; NPDR non-proliferative diabetic retinopathy; PDR  proliferative diabetic retinopathy; CSME clinically significant macular edema; DME diabetic macular edema; dbh dot blot hemorrhages; CWS cotton wool spot; POAG primary open angle glaucoma; C/D cup-to-disc ratio; HVF humphrey visual field; GVF goldmann visual field; OCT optical coherence tomography; IOP intraocular pressure; BRVO Branch retinal vein occlusion; CRVO central retinal vein occlusion; CRAO central retinal artery occlusion; BRAO branch retinal artery occlusion; RT retinal tear; SB scleral buckle; PPV pars plana vitrectomy; VH Vitreous hemorrhage; PRP panretinal laser photocoagulation; IVK intravitreal kenalog; VMT vitreomacular traction; MH Macular hole;  NVD neovascularization of the disc; NVE neovascularization elsewhere; AREDS age related eye disease study; ARMD age related macular degeneration; POAG primary open angle glaucoma; EBMD epithelial/anterior basement membrane dystrophy; ACIOL anterior chamber intraocular lens; IOL intraocular lens; PCIOL posterior chamber intraocular lens; Phaco/IOL phacoemulsification with intraocular lens placement; [place] photorefractive keratectomy; LASIK laser assisted in situ keratomileusis; HTN hypertension; DM diabetes mellitus; COPD chronic obstructive pulmonary disease

## 2021-09-25 ENCOUNTER — Ambulatory Visit (INDEPENDENT_AMBULATORY_CARE_PROVIDER_SITE_OTHER): Payer: Medicare Other | Admitting: Ophthalmology

## 2021-09-25 ENCOUNTER — Encounter (INDEPENDENT_AMBULATORY_CARE_PROVIDER_SITE_OTHER): Payer: Self-pay | Admitting: Ophthalmology

## 2021-09-25 DIAGNOSIS — H34832 Tributary (branch) retinal vein occlusion, left eye, with macular edema: Secondary | ICD-10-CM

## 2021-09-25 DIAGNOSIS — H353221 Exudative age-related macular degeneration, left eye, with active choroidal neovascularization: Secondary | ICD-10-CM

## 2021-09-25 DIAGNOSIS — H35722 Serous detachment of retinal pigment epithelium, left eye: Secondary | ICD-10-CM | POA: Diagnosis not present

## 2021-09-25 MED ORDER — BEVACIZUMAB 2.5 MG/0.1ML IZ SOSY
2.5000 mg | PREFILLED_SYRINGE | INTRAVITREAL | Status: AC | PRN
  Administered 2021-09-25: 2.5 mg via INTRAVITREAL

## 2021-09-25 NOTE — Progress Notes (Signed)
? ? ?09/25/2021 ? ?  ? ?CHIEF COMPLAINT ?Patient presents for  ?Chief Complaint  ?Patient presents with  ? Branch Retinal Vein Occlusion  ? Macular Degeneration  ? ? ? ? ?HISTORY OF PRESENT ILLNESS: ?Carrie Osborne is a 81 y.o. female who presents to the clinic today for:  ? ?HPI   ?5 weeks dilate OS, Avastin OS, OCT. ?Patient is not allergic to anything we use for injection. ?Patient states her vision is the same, states "I haven't noticed any difference but my left eye is already bad." Denies any new floaters or FOL. ?Patient states "I have cataracts, in the dark I see black. In the daytime I can see fine." ? ?Last edited by Laurin Coder on 09/25/2021  2:08 PM.  ?  ? ? ?Referring physician: ?Celene Squibb, MD ?[address] ?[address] ?[place] [address] ? ?HISTORICAL INFORMATION:  ? ?Selected notes from the [facility] ?  ?   ? ?CURRENT MEDICATIONS: ?No current outpatient medications on file. (Ophthalmic Drugs)  ? ?No current facility-administered medications for this visit. (Ophthalmic Drugs)  ? ?Current Outpatient Medications (Other)  ?Medication Sig  ? Calcium Carbonate-Vitamin D (CALCIUM-VITAMIN D3 PO) Take 1 tablet by mouth 2 (two) times daily.  ? cetirizine (ZYRTEC) 10 MG tablet Take 10 mg by mouth daily.  ? Coenzyme Q10 (CO Q-10) 100 MG CAPS Take 100 mg by mouth daily.   ? hydrochlorothiazide (HYDRODIURIL) 25 MG tablet Take 1 tablet by mouth once daily  ? levothyroxine (SYNTHROID) 75 MCG tablet Take 1 tablet (75 mcg total) by mouth daily before breakfast.  ? lisinopril (ZESTRIL) 10 MG tablet Take 10 mg by mouth daily.  ? metoprolol succinate (TOPROL-XL) 100 MG 24 hr tablet Take 100 mg by mouth daily. Take with or immediately following a meal.  ? mometasone (NASONEX) 50 MCG/ACT nasal spray Place 1-2 sprays into the nose daily as needed (for allergies).   ? Multiple Vitamins-Minerals (CENTRUM SILVER PO) Take 1 tablet by mouth daily.  ? Na Sulfate-K Sulfate-Mg Sulf 17.5-3.13-1.6 GM/177ML SOLN Take 1  kit by mouth as directed.  ? Omega-3 Fatty Acids (FISH OIL) 1200 MG CAPS Take 1,200 mg by mouth daily.   ? simvastatin (ZOCOR) 40 MG tablet Take 40 mg by mouth at bedtime.   ? ?No current facility-administered medications for this visit. (Other)  ? ? ? ? ?REVIEW OF SYSTEMS: ? ? ? ?ALLERGIES ?Allergies  ?Allergen Reactions  ? Penicillins Hives, Itching, Rash and Other (See Comments)  ?  Has patient had a PCN reaction causing immediate rash, facial/tongue/throat swelling, SOB or lightheadedness with hypotension: No ?Has patient had a PCN reaction causing severe rash involving mucus membranes or skin necrosis: No ?Has patient had a PCN reaction that required hospitalization: No ?Has patient had a PCN reaction occurring within the last 10 years: Yes ?If all of the above answers are "NO", then may proceed with Cephalosporin use. ?  ? ? ?PAST MEDICAL HISTORY ?Past Medical History:  ?Diagnosis Date  ? Arthritis   ? Baker's cyst   ? Hypothyroidism   ? Osteoarthritis   ? ?Past Surgical History:  ?Procedure Laterality Date  ? ABDOMINAL HYSTERECTOMY    ? COLONOSCOPY N/A 07/04/2017  ? Procedure: COLONOSCOPY;  Surgeon: Daneil Dolin, MD;  [facility];  Service: Endoscopy;  Laterality: N/A;  12:45pm  ? TONSILLECTOMY    ? ? ?FAMILY HISTORY ?Family History  ?Problem Relation Age of Onset  ? Colon cancer Neg  Hx   ? ? ?SOCIAL HISTORY ?Social History  ? ?Tobacco Use  ? Smoking status: Never  ? Smokeless tobacco: Never  ?Vaping Use  ? Vaping Use: Never used  ?Substance Use Topics  ? Alcohol use: No  ? Drug use: No  ? ?  ? ?  ? ?OPHTHALMIC EXAM: ? ?Base Eye Exam   ? ? Visual Acuity (ETDRS)   ? ?   Right Left  ? Dist cc 20/20 -2 20/200 -1  ? Dist ph cc  NI  ? ? Correction: Glasses  ?Pt reports for the left eye "the screen is black, I can only see the S" on the 20/200 line. ? ?  ?  ? ? Tonometry (Tonopen, 2:10 PM)   ? ?   Right Left  ? Pressure 6 14  ? ?  ?  ? ? Pupils   ? ?   Pupils Dark Light APD  ? Right PERRL 3 2 None  ?  Left PERRL 3 2 None  ? ?  ?  ? ? Visual Fields (Counting fingers)   ? ?   Left Right  ?  Full Full  ? ?  ?  ? ? Extraocular Movement   ? ?   Right Left  ?  Full Full  ? ?  ?  ? ? Neuro/Psych   ? ? Oriented x3: Yes  ? Mood/Affect: Normal  ? ?  ?  ? ? Dilation   ? ? Left eye: 1.0% Mydriacyl, 2.5% Phenylephrine @ 2:10 PM  ? ?  ?  ? ?  ? ?Slit Lamp and Fundus Exam   ? ? External Exam   ? ?   Right Left  ? External Normal Normal  ? ?  ?  ? ? Slit Lamp Exam   ? ?   Right Left  ? Lids/Lashes Normal Normal  ? Conjunctiva/Sclera White and quiet White and quiet  ? Cornea Clear Clear  ? Anterior Chamber Deep and quiet Deep and quiet  ? Iris Round and reactive Round and reactive  ? Lens 3+ Nuclear sclerosis 3+ Nuclear sclerosis  ? Anterior Vitreous Normal Normal  ? ?  ?  ? ? Fundus Exam   ? ?   Right Left  ? Posterior Vitreous  Posterior vitreous detachment old central vitreous floaters  ? Disc  Normal  ? C/D Ratio  0.55  ? Macula  Microaneurysms, Soft drusen, no exudates, no hemorrhage, Disciform scar, Cystoid macular edema, Macular thickening  ? Vessels  Old branch retinal vein occlusion, with minor CME temporal to the fovea, not in the FAZ, no N/V  ? Periphery  Normal  ? ?  ?  ? ?  ? ? ?IMAGING AND PROCEDURES  ?Imaging and Procedures for 09/25/21 ? ?Intravitreal Injection, Pharmacologic Agent - OS - Left Eye   ? ?   ?Time Out ?09/25/2021. 2:39 PM. Confirmed correct patient, procedure, site, and patient consented.  ? ?Anesthesia ?Topical anesthesia was used. Anesthetic medications included Lidocaine 4%.  ? ?Procedure ?Preparation included 10% betadine to eyelids, Tobramycin 0.3%. A 30 gauge needle was used.  ? ?Injection: ?2.5 mg bevacizumab 2.5 MG/0.1ML ?  Route: Intravitreal, Site: Left Eye ?  [place]: [id number], Lot: [id number]  ? ?Post-op ?Post injection exam found visual acuity of at least counting fingers. The patient tolerated the procedure well. There were no complications. The patient received written and verbal post  procedure care education. Post injection medications included ocuflox.  ? ?  ? ?  OCT, Retina - OU - Both Eyes   ? ?   ?Right Eye ?Quality was good. Scan locations included subfoveal. Central Foveal Thickness: 283. Progression has been stable. Findings include retinal drusen .  ? ?Left Eye ?Quality was good. Scan locations included subfoveal. Central Foveal Thickness: 292. Findings include cystoid macular edema, retinal drusen , subretinal hyper-reflective material, subretinal fluid, intraretinal fluid, pigment epithelial detachment.  ? ?Notes ?OD, no active maculopathy,  ?OS with history of macular BRVO postinjection for large subfoveal PED, now with evidence of an RPE rip, with RPE change and atrophy superior, and scrolled RPE subfoveal, much less subretinal fluid and much less intraretinal fluid, but in good vision OS, with much less intraretinal edema and CME ? ? ? ?  ? ? ?  ?  ? ?  ?ASSESSMENT/PLAN: ? ?Branch retinal vein occlusion with macular edema of left eye ?Superimposed upon macular degeneration and with an RPE rip subfoveal as well ? ?Exudative age-related macular degeneration of left eye with active choroidal neovascularization ([place]) ?Large subfoveal PED with overlying fluid CME with the underlying BRVO from the past, today at follow-up interval of 5 weeks vastly improved macular findings.  We will repeat Avastin OS today  ? ?  ICD-10-CM   ?1. Exudative age-related macular degeneration of left eye with active choroidal neovascularization ([facility])  H35.3221 Intravitreal Injection, Pharmacologic Agent - OS - Left Eye  ?  ?2. Branch retinal vein occlusion with macular edema of left eye  H34.8320 Intravitreal Injection, Pharmacologic Agent - OS - Left Eye  ?  OCT, Retina - OU - Both Eyes  ?  bevacizumab (AVASTIN) SOSY 2.5 mg  ?  ?3. 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  ? ? ?1.  OS, with new onset subfoveal macular RPE detachment and wet AMD, January  2023.  Improved post Avastin.  However persistent active disease in the foveal location with the previous history of BRVO OS.  Repeat injection today for the wet AMD and follow-up again in 5 weeks ? ?2. ? ?3.

## 2021-09-25 NOTE — Assessment & Plan Note (Signed)
Superimposed upon macular degeneration and with an RPE rip subfoveal as well ?

## 2021-09-25 NOTE — Assessment & Plan Note (Signed)
Large subfoveal PED with overlying fluid CME with the underlying BRVO from the past, today at follow-up interval of 5 weeks vastly improved macular findings.  We will repeat Avastin OS today ?

## 2021-10-08 ENCOUNTER — Encounter (INDEPENDENT_AMBULATORY_CARE_PROVIDER_SITE_OTHER): Payer: Medicare Other | Admitting: Ophthalmology

## 2021-10-09 ENCOUNTER — Encounter (INDEPENDENT_AMBULATORY_CARE_PROVIDER_SITE_OTHER): Payer: Medicare Other | Admitting: Ophthalmology

## 2021-10-30 ENCOUNTER — Encounter (INDEPENDENT_AMBULATORY_CARE_PROVIDER_SITE_OTHER): Payer: Self-pay | Admitting: Ophthalmology

## 2021-10-30 ENCOUNTER — Ambulatory Visit (INDEPENDENT_AMBULATORY_CARE_PROVIDER_SITE_OTHER): Payer: Medicare Other | Admitting: Ophthalmology

## 2021-10-30 DIAGNOSIS — H348322 Tributary (branch) retinal vein occlusion, left eye, stable: Secondary | ICD-10-CM | POA: Diagnosis not present

## 2021-10-30 DIAGNOSIS — H34832 Tributary (branch) retinal vein occlusion, left eye, with macular edema: Secondary | ICD-10-CM

## 2021-10-30 DIAGNOSIS — H353221 Exudative age-related macular degeneration, left eye, with active choroidal neovascularization: Secondary | ICD-10-CM

## 2021-10-30 MED ORDER — AFLIBERCEPT 2MG/0.05ML IZ SOLN FOR KALEIDOSCOPE
2.0000 mg | INTRAVITREAL | Status: AC | PRN
  Administered 2021-10-30: 2 mg via INTRAVITREAL

## 2021-10-30 NOTE — Assessment & Plan Note (Signed)
No evidence of worsening OS ?

## 2021-10-30 NOTE — Assessment & Plan Note (Signed)
OS large subfoveal PED with intraretinal fluid subretinal hemorrhage.  No change over the last 5 weeks post injection Avastin. ? ?After discussion we will change to [facility] today. ?

## 2021-10-30 NOTE — Progress Notes (Signed)
? ? ?10/30/2021 ? ?  ? ?CHIEF COMPLAINT ?Patient presents for  ?Chief Complaint  ?Patient presents with  ? Macular Degeneration  ? ? ? ? ?HISTORY OF PRESENT ILLNESS: ?Carrie Osborne is a 81 y.o. female who presents to the clinic today for:  ? ?HPI   ?5 weeks dilate OS, Avastin OS, OCT. ?Patient states vision is stable and unchanged since last visit. Denies any new floaters or FOL. ?Patient states "I believe it has improved a little on the left eye. I am hoping it has." ?Patient Preservision AREDS twice daily. ? ?Last edited by Laurin Coder on 10/30/2021  2:47 PM.  ?  ? ? ?Referring physician: ?Celene Squibb, MD ?[address] ?[address] ?[place],  [place] [id number] ? ?HISTORICAL INFORMATION:  ? ?Selected notes from the [facility] ?  ?   ? ?CURRENT MEDICATIONS: ?No current outpatient medications on file. (Ophthalmic Drugs)  ? ?No current facility-administered medications for this visit. (Ophthalmic Drugs)  ? ?Current Outpatient Medications (Other)  ?Medication Sig  ? Multiple Vitamins-Minerals (PRESERVISION AREDS 2+MULTI VIT PO) Take 2 capsules by mouth in the morning and at bedtime.  ? Calcium Carbonate-Vitamin D (CALCIUM-VITAMIN D3 PO) Take 1 tablet by mouth 2 (two) times daily.  ? cetirizine (ZYRTEC) 10 MG tablet Take 10 mg by mouth daily.  ? Coenzyme Q10 (CO Q-10) 100 MG CAPS Take 100 mg by mouth daily.   ? hydrochlorothiazide (HYDRODIURIL) 25 MG tablet Take 1 tablet by mouth once daily  ? levothyroxine (SYNTHROID) 75 MCG tablet Take 1 tablet (75 mcg total) by mouth daily before breakfast.  ? lisinopril (ZESTRIL) 10 MG tablet Take 10 mg by mouth daily.  ? metoprolol succinate (TOPROL-XL) 100 MG 24 hr tablet Take 100 mg by mouth daily. Take with or immediately following a meal.  ? mometasone (NASONEX) 50 MCG/ACT nasal spray Place 1-2 sprays into the nose daily as needed (for allergies).   ? Multiple Vitamins-Minerals (CENTRUM SILVER PO) Take 1 tablet by mouth daily.  ? Na Sulfate-K Sulfate-Mg Sulf 17.5-3.13-1.6  GM/177ML SOLN Take 1 kit by mouth as directed.  ? Omega-3 Fatty Acids (FISH OIL) 1200 MG CAPS Take 1,200 mg by mouth daily.   ? simvastatin (ZOCOR) 40 MG tablet Take 40 mg by mouth at bedtime.   ? ?No current facility-administered medications for this visit. (Other)  ? ? ? ? ?REVIEW OF SYSTEMS: ?ROS   ?Negative for: Constitutional, Gastrointestinal, Neurological, Skin, Genitourinary, Musculoskeletal, HENT, Endocrine, Cardiovascular, Eyes, Respiratory, Psychiatric, Allergic/Imm, Heme/Lymph ?Last edited by Hurman Horn, MD on 10/30/2021  3:45 PM.  ?  ? ? ? ?ALLERGIES ?Allergies  ?Allergen Reactions  ? Penicillins Hives, Itching, Rash and Other (See Comments)  ?  Has patient had a PCN reaction causing immediate rash, facial/tongue/throat swelling, SOB or lightheadedness with hypotension: No ?Has patient had a PCN reaction causing severe rash involving mucus membranes or skin necrosis: No ?Has patient had a PCN reaction that required hospitalization: No ?Has patient had a PCN reaction occurring within the last 10 years: Yes ?If all of the above answers are "NO", then may proceed with Cephalosporin use. ?  ? ? ?PAST MEDICAL HISTORY ?Past Medical History:  ?Diagnosis Date  ? Arthritis   ? Baker's cyst   ? Hypothyroidism   ? Osteoarthritis   ? ?Past Surgical History:  ?Procedure Laterality Date  ? ABDOMINAL HYSTERECTOMY    ? COLONOSCOPY N/A 07/04/2017  ? Procedure: COLONOSCOPY;  Surgeon: Daneil Dolin, MD;  [facility]: [facility];  Service: Endoscopy;  Laterality: N/A;  12:45pm  ? TONSILLECTOMY    ? ? ?FAMILY HISTORY ?Family History  ?Problem Relation Age of Onset  ? Colon cancer Neg Hx   ? ? ?SOCIAL HISTORY ?Social History  ? ?Tobacco Use  ? Smoking status: Never  ? Smokeless tobacco: Never  ?Vaping Use  ? Vaping Use: Never used  ?Substance Use Topics  ? Alcohol use: No  ? Drug use: No  ? ?  ? ?  ? ?OPHTHALMIC EXAM: ? ?Base Eye Exam   ? ? Visual Acuity (ETDRS)   ? ?   Right Left  ? Dist [place] 20/20 -1 CF at 3'  ? Dist ph  [place]  NI  ? ?  ?  ? ? Tonometry (Tonopen, 2:50 PM)   ? ?   Right Left  ? Pressure 10 15  ? ?  ?  ? ? Pupils   ? ?   Pupils Dark Light APD  ? Right PERRL 4 3 None  ? Left PERRL 4 3 None  ? ?  ?  ? ? Extraocular Movement   ? ?   Right Left  ?  Full Full  ? ?  ?  ? ? Neuro/Psych   ? ? Oriented x3: Yes  ? Mood/Affect: Normal  ? ?  ?  ? ? Dilation   ? ? Left eye: 1.0% Mydriacyl, 2.5% Phenylephrine @ 2:50 PM  ? ?  ?  ? ?  ? ?Slit Lamp and Fundus Exam   ? ? External Exam   ? ?   Right Left  ? External Normal Normal  ? ?  ?  ? ? Slit Lamp Exam   ? ?   Right Left  ? Lids/Lashes Normal Normal  ? Conjunctiva/Sclera White and quiet White and quiet  ? Cornea Clear Clear  ? Anterior Chamber Deep and quiet Deep and quiet  ? Iris Round and reactive Round and reactive  ? Lens 3+ Nuclear sclerosis 3+ Nuclear sclerosis  ? Anterior Vitreous Normal Normal  ? ?  ?  ? ? Fundus Exam   ? ?   Right Left  ? Posterior Vitreous  Posterior vitreous detachment old central vitreous floaters  ? Disc  Normal  ? C/D Ratio  0.55  ? Macula  Microaneurysms, Soft drusen, no exudates, no hemorrhage, Disciform scar, Cystoid macular edema, Macular thickening  ? Vessels  Old branch retinal vein occlusion, with minor CME temporal to the fovea, not in the FAZ, no N/V  ? Periphery  Normal  ? ?  ?  ? ?  ? ? ?IMAGING AND PROCEDURES  ?Imaging and Procedures for 10/30/21 ? ?OCT, Retina - OU - Both Eyes   ? ?   ?Right Eye ?Quality was good. Scan locations included subfoveal. Central Foveal Thickness: 283. Progression has been stable. Findings include retinal drusen .  ? ?Left Eye ?Quality was good. Scan locations included subfoveal. Central Foveal Thickness: 306. Findings include cystoid macular edema, retinal drusen , subretinal hyper-reflective material, subretinal fluid, intraretinal fluid, pigment epithelial detachment.  ? ?Notes ?OD, no active maculopathy,  ?OS with history of macular BRVO postinjection for large subfoveal PED, now with evidence of an RPE rip,  with RPE change and atrophy superior, and scrolled RPE subfoveal,increased subretinal fluid and increased intraretinal fluid, but in good vision OS, with much less intraretinal edema and CME ? ? ? ?  ? ?Intravitreal Injection, Pharmacologic Agent - OS - Left Eye   ? ?   ?  Time Out ?10/30/2021. 3:49 PM. Confirmed correct patient, procedure, site, and patient consented.  ? ?Anesthesia ?Topical anesthesia was used. Anesthetic medications included Lidocaine 4%.  ? ?Procedure ?Preparation included 10% betadine to eyelids, Tobramycin 0.3%, 5% betadine to ocular surface. A 30 gauge needle was used.  ? ?Injection: ?2 mg aflibercept 2 MG/0.05ML ?  Route: Intravitreal, Site: Left Eye ?  [place]: [id number], Lot: [id number], Waste: 0 mL  ? ?Post-op ?Post injection exam found visual acuity of at least counting fingers. The patient tolerated the procedure well. There were no complications. The patient received written and verbal post procedure care education. Post injection medications included ocuflox.  ? ?  ? ? ?  ?  ? ?  ?ASSESSMENT/PLAN: ? ?Exudative age-related macular degeneration of left eye with active choroidal neovascularization ([place]) ?OS large subfoveal PED with intraretinal fluid subretinal hemorrhage.  No change over the last 5 weeks post injection Avastin. ? ?After discussion we will change to [facility] today. ? ?Branch retinal vein occlusion with no neovascularization of left eye ?No evidence of worsening OS  ? ?  ICD-10-CM   ?1. Branch retinal vein occlusion with macular edema of left eye  H34.8320 OCT, Retina - OU - Both Eyes  ?  Intravitreal Injection, Pharmacologic Agent - OS - Left Eye  ?  aflibercept (EYLEA) SOLN 2 mg  ?  ?2. Exudative age-related macular degeneration of left eye with active choroidal neovascularization ([place])  H35.3221   ?  ?3. Branch retinal vein occlusion with no neovascularization of left eye  R56.1537   ?  ? ? ?1.  OS vastly improved overall since onset of therapy but no interval change today  with increased slightly intraretinal fluid in the foveal region. ? ?Will repeat injection today but will change from Avastin today to Eylea OS. ? ?2. ? ?3. ? ?Ophthalmic Meds Ordered this visit:  ?Meds ordere

## 2021-11-02 DIAGNOSIS — E782 Mixed hyperlipidemia: Secondary | ICD-10-CM | POA: Diagnosis not present

## 2021-11-02 DIAGNOSIS — R7301 Impaired fasting glucose: Secondary | ICD-10-CM | POA: Diagnosis not present

## 2021-11-02 DIAGNOSIS — E559 Vitamin D deficiency, unspecified: Secondary | ICD-10-CM | POA: Diagnosis not present

## 2021-11-02 DIAGNOSIS — E039 Hypothyroidism, unspecified: Secondary | ICD-10-CM | POA: Diagnosis not present

## 2021-11-07 DIAGNOSIS — H353132 Nonexudative age-related macular degeneration, bilateral, intermediate dry stage: Secondary | ICD-10-CM | POA: Diagnosis not present

## 2021-11-07 DIAGNOSIS — H7292 Unspecified perforation of tympanic membrane, left ear: Secondary | ICD-10-CM | POA: Diagnosis not present

## 2021-11-07 DIAGNOSIS — E782 Mixed hyperlipidemia: Secondary | ICD-10-CM | POA: Diagnosis not present

## 2021-11-07 DIAGNOSIS — Z6830 Body mass index (BMI) 30.0-30.9, adult: Secondary | ICD-10-CM | POA: Diagnosis not present

## 2021-11-07 DIAGNOSIS — J302 Other seasonal allergic rhinitis: Secondary | ICD-10-CM | POA: Diagnosis not present

## 2021-11-07 DIAGNOSIS — I10 Essential (primary) hypertension: Secondary | ICD-10-CM | POA: Diagnosis not present

## 2021-11-07 DIAGNOSIS — E039 Hypothyroidism, unspecified: Secondary | ICD-10-CM | POA: Diagnosis not present

## 2021-11-07 DIAGNOSIS — E559 Vitamin D deficiency, unspecified: Secondary | ICD-10-CM | POA: Diagnosis not present

## 2021-11-07 DIAGNOSIS — M5431 Sciatica, right side: Secondary | ICD-10-CM | POA: Diagnosis not present

## 2021-11-07 DIAGNOSIS — R7301 Impaired fasting glucose: Secondary | ICD-10-CM | POA: Diagnosis not present

## 2021-12-04 ENCOUNTER — Ambulatory Visit (INDEPENDENT_AMBULATORY_CARE_PROVIDER_SITE_OTHER): Payer: Medicare Other | Admitting: Ophthalmology

## 2021-12-04 ENCOUNTER — Encounter (INDEPENDENT_AMBULATORY_CARE_PROVIDER_SITE_OTHER): Payer: Self-pay | Admitting: Ophthalmology

## 2021-12-04 DIAGNOSIS — H34832 Tributary (branch) retinal vein occlusion, left eye, with macular edema: Secondary | ICD-10-CM

## 2021-12-04 DIAGNOSIS — H353221 Exudative age-related macular degeneration, left eye, with active choroidal neovascularization: Secondary | ICD-10-CM

## 2021-12-04 MED ORDER — AFLIBERCEPT 2MG/0.05ML IZ SOLN FOR KALEIDOSCOPE
2.0000 mg | INTRAVITREAL | Status: AC | PRN
  Administered 2021-12-04: 2 mg via INTRAVITREAL

## 2021-12-04 NOTE — Assessment & Plan Note (Signed)
OS superimposed upon wet AMD OS

## 2021-12-04 NOTE — Assessment & Plan Note (Signed)
OS, new onset scrolled RPE rip, subfoveal PED postinjection April 2023.  Repeat Eylea OS today

## 2021-12-04 NOTE — Progress Notes (Signed)
12/04/2021     CHIEF COMPLAINT Patient presents for  Chief Complaint  Patient presents with   Branch Retinal Vein Occlusion      HISTORY OF PRESENT ILLNESS: Carrie Osborne is a 81 y.o. female who presents to the clinic today for:   HPI   5 weeks dilate OS, Eylea OS, OCT. Patient states "everything is the same. If I am at home I can see the TV if I put my hand over my left eye but when I get here I can't see anything it seems." Denies hospitalizations or surgeries since last visit. Patient takes AREDS 2 twice daily. Last edited by Laurin Coder on 12/04/2021 10:17 AM.      Referring physician: Celene Squibb, MD [address],  [place] [address]  HISTORICAL INFORMATION:   Selected notes from the [place]: No current outpatient medications on file. (Ophthalmic Drugs)   No current facility-administered medications for this visit. (Ophthalmic Drugs)   Current Outpatient Medications (Other)  Medication Sig   Calcium Carbonate-Vitamin D (CALCIUM-VITAMIN D3 PO) Take 1 tablet by mouth 2 (two) times daily.   cetirizine (ZYRTEC) 10 MG tablet Take 10 mg by mouth daily.   Coenzyme Q10 (CO Q-10) 100 MG CAPS Take 100 mg by mouth daily.    hydrochlorothiazide (HYDRODIURIL) 25 MG tablet Take 1 tablet by mouth once daily   levothyroxine (SYNTHROID) 75 MCG tablet Take 1 tablet (75 mcg total) by mouth daily before breakfast.   lisinopril (ZESTRIL) 10 MG tablet Take 10 mg by mouth daily.   metoprolol succinate (TOPROL-XL) 100 MG 24 hr tablet Take 100 mg by mouth daily. Take with or immediately following a meal.   mometasone (NASONEX) 50 MCG/ACT nasal spray Place 1-2 sprays into the nose daily as needed (for allergies).    Multiple Vitamins-Minerals (CENTRUM SILVER PO) Take 1 tablet by mouth daily.   Multiple Vitamins-Minerals (PRESERVISION AREDS 2+MULTI VIT PO) Take 2 capsules by mouth in the morning and at bedtime.   Na Sulfate-K  Sulfate-Mg Sulf 17.5-3.13-1.6 GM/177ML SOLN Take 1 kit by mouth as directed.   Omega-3 Fatty Acids (FISH OIL) 1200 MG CAPS Take 1,200 mg by mouth daily.    simvastatin (ZOCOR) 40 MG tablet Take 40 mg by mouth at bedtime.    No current facility-administered medications for this visit. (Other)      REVIEW OF SYSTEMS: ROS   Negative for: Constitutional, Gastrointestinal, Neurological, Skin, Genitourinary, Musculoskeletal, HENT, Endocrine, Cardiovascular, Eyes, Respiratory, Psychiatric, Allergic/Imm, Heme/Lymph Last edited by Laurin Coder on 12/04/2021 10:17 AM.       ALLERGIES Allergies  Allergen Reactions   Penicillins Hives, Itching, Rash and Other (See Comments)    Has patient had a PCN reaction causing immediate rash, facial/tongue/throat swelling, SOB or lightheadedness with hypotension: No Has patient had a PCN reaction causing severe rash involving mucus membranes or skin necrosis: No Has patient had a PCN reaction that required hospitalization: No Has patient had a PCN reaction occurring within the last 10 years: Yes If all of the above answers are "NO", then may proceed with Cephalosporin use.     PAST MEDICAL HISTORY Past Medical History:  Diagnosis Date   Arthritis    Baker's cyst    Hypothyroidism    Osteoarthritis    Past Surgical History:  Procedure Laterality Date   ABDOMINAL HYSTERECTOMY     COLONOSCOPY N/A 07/04/2017   Procedure: COLONOSCOPY;  Surgeon: Daneil Dolin, MD;  [facility];  Service: Endoscopy;  Laterality: N/A;  12:45pm   TONSILLECTOMY      FAMILY HISTORY Family History  Problem Relation Age of Onset   Colon cancer Neg Hx     SOCIAL HISTORY Social History   Tobacco Use   Smoking status: Never   Smokeless tobacco: Never  Vaping Use   Vaping Use: Never used  Substance Use Topics   Alcohol use: No   Drug use: No         OPHTHALMIC EXAM:  Base Eye Exam     Visual Acuity (ETDRS)       Right Left   Dist cc  20/20 -1 CF at 3'   Dist ph cc  NI    Correction: Glasses         Tonometry (Tonopen, 10:21 AM)       Right Left   Pressure 12 12         Pupils       Pupils Dark Light APD   Right PERRL 4 3 None   Left PERRL 4 3 None         Visual Fields (Counting fingers)       Left Right     Full   Restrictions Total inferior nasal deficiency          Extraocular Movement       Right Left    Full Full         Neuro/Psych     Oriented x3: Yes   Mood/Affect: Normal         Dilation     Left eye: 1.0% Mydriacyl, 2.5% Phenylephrine @ 10:21 AM           Slit Lamp and Fundus Exam     External Exam       Right Left   External Normal Normal         Slit Lamp Exam       Right Left   Lids/Lashes Normal Normal   Conjunctiva/Sclera White and quiet White and quiet   Cornea Clear Clear   Anterior Chamber Deep and quiet Deep and quiet   Iris Round and reactive Round and reactive   Lens 3+ Nuclear sclerosis 3+ Nuclear sclerosis   Anterior Vitreous Normal Normal         Fundus Exam       Right Left   Posterior Vitreous  Posterior vitreous detachment old central vitreous floaters   Disc  Normal   C/D Ratio  0.55   Macula  Microaneurysms, Soft drusen, no exudates, no hemorrhage, Disciform scar, Cystoid macular edema, Macular thickening, Retinal pigment epithelial atrophy, nodular excrescence of RPE rip, and residual RPE detachment   Vessels  Old branch retinal vein occlusion, with minor CME temporal to the fovea, not in the FAZ, no N/V   Periphery  Normal            IMAGING AND PROCEDURES  Imaging and Procedures for 12/04/21  Intravitreal Injection, Pharmacologic Agent - OS - Left Eye       Time Out 12/04/2021. 10:56 AM. Confirmed correct patient, procedure, site, and patient consented.   Anesthesia Topical anesthesia was used. Anesthetic medications included Lidocaine 4%.   Procedure Preparation included 5% betadine to ocular surface,  10% betadine to eyelids, Tobramycin 0.3%. A 30 gauge needle was used.   Injection: 2 mg aflibercept 2 MG/0.05ML   Route: Intravitreal, Site: Left Eye  NDC: [id number], Lot: [id number], Waste: 0 mL   Post-op Post injection exam found visual acuity of at least counting fingers. The patient tolerated the procedure well. There were no complications. The patient received written and verbal post procedure care education. Post injection medications included ocuflox.      OCT, Retina - OU - Both Eyes       Right Eye Quality was good. Scan locations included subfoveal. Central Foveal Thickness: 275. Progression has been stable. Findings include retinal drusen .   Left Eye Quality was good. Scan locations included subfoveal. Central Foveal Thickness: 756. Findings include retinal drusen , subretinal hyper-reflective material, cystoid macular edema, intraretinal fluid, pigment epithelial detachment, subretinal fluid.   Notes OD, no active maculopathy,    OS with history of macular BRVO postinjection for large subfoveal PED, now with evidence of an RPE rip, with RPE change and atrophy superior, and scrolled RPE subfoveal,increased subretinal fluid and increased intraretinal fluid, but in good vision OS, with much less intraretinal edema and CME, overall stable at 5 weeks post recent Eylea.  Repeat injection today               ASSESSMENT/PLAN:  Branch retinal vein occlusion with macular edema of left eye OS superimposed upon wet AMD OS  Exudative age-related macular degeneration of left eye with active choroidal neovascularization ([place]) OS, new onset scrolled RPE rip, subfoveal PED postinjection April 2023.  Repeat Eylea OS today      ICD-10-CM   1. Branch retinal vein occlusion with macular edema of left eye  H34.8320 Intravitreal Injection, Pharmacologic Agent - OS - Left Eye    OCT, Retina - OU - Both Eyes    aflibercept (EYLEA) SOLN 2 mg    2. Exudative age-related  macular degeneration of left eye with active choroidal neovascularization ([place])  H35.3221       1.  OS with RPE rip and scrolled RPE detachment.  With intraretinal fluid.  Repeat Eylea today to control wet AMD component and stabilize and hopefully prevent scotoma enlargement  2.  3.  Ophthalmic Meds Ordered this visit:  Meds ordered this encounter  Medications   aflibercept (EYLEA) SOLN 2 mg       Return in about 6 weeks (around 01/15/2022) for dilate, OS, EYLEA OCT.  There are no Patient Instructions on file for this visit.   Explained the diagnoses, plan, and follow up with the patient and they expressed understanding.  Patient expressed understanding of the importance of proper follow up care.   Clent Demark Melva Faux M.D. Diseases & Surgery of the Retina and Vitreous Retina & Diabetic [place] 12/04/21     Abbreviations: M myopia (nearsighted); A astigmatism; H hyperopia (farsighted); P presbyopia; Mrx spectacle prescription;  CTL contact lenses; OD right eye; OS left eye; OU both eyes  XT exotropia; ET esotropia; PEK punctate epithelial keratitis; PEE punctate epithelial erosions; DES dry eye syndrome; MGD meibomian gland dysfunction; ATs artificial tears; PFAT's preservative free artificial tears; [place] nuclear sclerotic cataract; PSC posterior subcapsular cataract; ERM epi-retinal membrane; PVD posterior vitreous detachment; RD retinal detachment; DM diabetes mellitus; DR diabetic retinopathy; NPDR non-proliferative diabetic retinopathy; PDR proliferative diabetic retinopathy; CSME clinically significant macular edema; DME diabetic macular edema; dbh dot blot hemorrhages; CWS cotton wool spot; POAG primary open angle glaucoma; C/D cup-to-disc ratio; HVF humphrey visual field; GVF goldmann visual field; OCT optical coherence tomography; IOP intraocular pressure; BRVO Branch retinal vein occlusion; CRVO central retinal vein occlusion; CRAO central retinal artery occlusion; BRAO  branch  retinal artery occlusion; RT retinal tear; SB scleral buckle; PPV pars plana vitrectomy; VH Vitreous hemorrhage; PRP panretinal laser photocoagulation; IVK intravitreal kenalog; VMT vitreomacular traction; MH Macular hole;  NVD neovascularization of the disc; NVE neovascularization elsewhere; AREDS age related eye disease study; ARMD age related macular degeneration; POAG primary open angle glaucoma; EBMD epithelial/anterior basement membrane dystrophy; ACIOL anterior chamber intraocular lens; IOL intraocular lens; PCIOL posterior chamber intraocular lens; Phaco/IOL phacoemulsification with intraocular lens placement; [place] photorefractive keratectomy; LASIK laser assisted in situ keratomileusis; HTN hypertension; DM diabetes mellitus; COPD chronic obstructive pulmonary disease

## 2022-01-15 ENCOUNTER — Ambulatory Visit (INDEPENDENT_AMBULATORY_CARE_PROVIDER_SITE_OTHER): Payer: Medicare Other | Admitting: Ophthalmology

## 2022-01-15 ENCOUNTER — Encounter (INDEPENDENT_AMBULATORY_CARE_PROVIDER_SITE_OTHER): Payer: Self-pay | Admitting: Ophthalmology

## 2022-01-15 DIAGNOSIS — H34832 Tributary (branch) retinal vein occlusion, left eye, with macular edema: Secondary | ICD-10-CM

## 2022-01-15 DIAGNOSIS — H353221 Exudative age-related macular degeneration, left eye, with active choroidal neovascularization: Secondary | ICD-10-CM | POA: Diagnosis not present

## 2022-01-15 DIAGNOSIS — H2513 Age-related nuclear cataract, bilateral: Secondary | ICD-10-CM | POA: Diagnosis not present

## 2022-01-15 MED ORDER — AFLIBERCEPT 2MG/0.05ML IZ SOLN FOR KALEIDOSCOPE
2.0000 mg | INTRAVITREAL | Status: AC | PRN
  Administered 2022-01-15: 2 mg via INTRAVITREAL

## 2022-01-15 NOTE — Assessment & Plan Note (Signed)
No visual symptoms at this time.  Observe

## 2022-01-15 NOTE — Progress Notes (Signed)
01/15/2022     CHIEF COMPLAINT Patient presents for  Chief Complaint  Patient presents with   Branch Retinal Vein Occlusion      HISTORY OF PRESENT ILLNESS: Carrie Osborne is a 81 y.o. female who presents to the clinic today for:   HPI   Today at 6-week follow-up post injection OS for CME from BRVO.  Patient reports slightly brightening of vision.  On the "expensive shot" Last edited by Hurman Horn, MD on 01/15/2022 10:23 AM.      Referring physician: Celene Squibb, MD [address],  [place] [address]  HISTORICAL INFORMATION:   Selected notes from the [place]: No current outpatient medications on file. (Ophthalmic Drugs)   No current facility-administered medications for this visit. (Ophthalmic Drugs)   Current Outpatient Medications (Other)  Medication Sig   Calcium Carbonate-Vitamin D (CALCIUM-VITAMIN D3 PO) Take 1 tablet by mouth 2 (two) times daily.   cetirizine (ZYRTEC) 10 MG tablet Take 10 mg by mouth daily.   Coenzyme Q10 (CO Q-10) 100 MG CAPS Take 100 mg by mouth daily.    hydrochlorothiazide (HYDRODIURIL) 25 MG tablet Take 1 tablet by mouth once daily   levothyroxine (SYNTHROID) 75 MCG tablet Take 1 tablet (75 mcg total) by mouth daily before breakfast.   lisinopril (ZESTRIL) 10 MG tablet Take 10 mg by mouth daily.   metoprolol succinate (TOPROL-XL) 100 MG 24 hr tablet Take 100 mg by mouth daily. Take with or immediately following a meal.   mometasone (NASONEX) 50 MCG/ACT nasal spray Place 1-2 sprays into the nose daily as needed (for allergies).    Multiple Vitamins-Minerals (CENTRUM SILVER PO) Take 1 tablet by mouth daily.   Multiple Vitamins-Minerals (PRESERVISION AREDS 2+MULTI VIT PO) Take 2 capsules by mouth in the morning and at bedtime.   Na Sulfate-K Sulfate-Mg Sulf 17.5-3.13-1.6 GM/177ML SOLN Take 1 kit by mouth as directed.   Omega-3 Fatty Acids (FISH OIL) 1200 MG CAPS Take 1,200 mg by mouth  daily.    simvastatin (ZOCOR) 40 MG tablet Take 40 mg by mouth at bedtime.    No current facility-administered medications for this visit. (Other)      REVIEW OF SYSTEMS: ROS   Negative for: Constitutional, Gastrointestinal, Neurological, Skin, Genitourinary, Musculoskeletal, HENT, Endocrine, Cardiovascular, Eyes, Respiratory, Psychiatric, Allergic/Imm, Heme/Lymph Last edited by Hurman Horn, MD on 01/15/2022 10:23 AM.       ALLERGIES Allergies  Allergen Reactions   Penicillins Hives, Itching, Rash and Other (See Comments)    Has patient had a PCN reaction causing immediate rash, facial/tongue/throat swelling, SOB or lightheadedness with hypotension: No Has patient had a PCN reaction causing severe rash involving mucus membranes or skin necrosis: No Has patient had a PCN reaction that required hospitalization: No Has patient had a PCN reaction occurring within the last 10 years: Yes If all of the above answers are "NO", then may proceed with Cephalosporin use.     PAST MEDICAL HISTORY Past Medical History:  Diagnosis Date   Arthritis    Baker's cyst    Hypothyroidism    Osteoarthritis    Past Surgical History:  Procedure Laterality Date   ABDOMINAL HYSTERECTOMY     COLONOSCOPY N/A 07/04/2017   Procedure: COLONOSCOPY;  Surgeon: Daneil Dolin, MD;  [facility];  Service: Endoscopy;  Laterality: N/A;  12:45pm   TONSILLECTOMY      FAMILY HISTORY Family History  Problem Relation Age of Onset   Colon cancer Neg Hx     SOCIAL HISTORY Social History   Tobacco Use   Smoking status: Never   Smokeless tobacco: Never  Vaping Use   Vaping Use: Never used  Substance Use Topics   Alcohol use: No   Drug use: No         OPHTHALMIC EXAM:  Base Eye Exam     Visual Acuity (ETDRS)       Right Left   Dist cc 20/20 -2 CF at 3'    Correction: Glasses         Tonometry (Tonopen, 10:22 AM)       Right Left   Pressure 15 16         Pupils        Pupils APD   Right PERRL None   Left PERRL None         Visual Fields       Left Right    Full Full         Extraocular Movement       Right Left    Full Full         Neuro/Psych     Oriented x3: Yes   Mood/Affect: Normal         Dilation     Both eyes: 1.0% Mydriacyl, 2.5% Phenylephrine @ 10:22 AM           Slit Lamp and Fundus Exam     External Exam       Right Left   External Normal Normal         Slit Lamp Exam       Right Left   Lids/Lashes Normal Normal   Conjunctiva/Sclera White and quiet White and quiet   Cornea Clear Clear   Anterior Chamber Deep and quiet Deep and quiet   Iris Round and reactive Round and reactive   Lens 3+ Nuclear sclerosis 3+ Nuclear sclerosis   Anterior Vitreous Normal Normal         Fundus Exam       Right Left   Posterior Vitreous  Posterior vitreous detachment old central vitreous floaters   Disc  Normal   C/D Ratio  0.55   Macula  Microaneurysms, Soft drusen, no exudates, no hemorrhage, Disciform scar, Cystoid macular edema, Macular thickening, Retinal pigment epithelial atrophy, nodular excrescence of RPE rip, and residual RPE detachment   Vessels  Old branch retinal vein occlusion, with minor CME temporal to the fovea, not in the FAZ, no N/V   Periphery  Normal            IMAGING AND PROCEDURES  Imaging and Procedures for 01/15/22  OCT, Retina - OU - Both Eyes       Right Eye Quality was good. Scan locations included subfoveal. Central Foveal Thickness: 276. Progression has been stable. Findings include retinal drusen .   Left Eye Quality was good. Scan locations included subfoveal. Central Foveal Thickness: 698. Findings include retinal drusen , subretinal hyper-reflective material, cystoid macular edema, intraretinal fluid, pigment epithelial detachment, subretinal fluid.   Notes OD, no active maculopathy,    OS with history of macular BRVO postinjection for large subfoveal  PED, now with evidence of an RPE rip, with RPE change and atrophy superior, and scrolled RPE subfoveal,increased subretinal fluid and increased intraretinal fluid, but in good vision OS, with much less intraretinal edema and CME, overall stable at 5 weeks post recent Eylea.  Repeat injection today much less intraretinal CME.  Vision limited by subfoveal scarring OS       Intravitreal Injection, Pharmacologic Agent - OS - Left Eye       Time Out 01/15/2022. 11:33 AM. Confirmed correct patient, procedure, site, and patient consented.   Anesthesia Topical anesthesia was used. Anesthetic medications included Lidocaine 4%.   Procedure Preparation included 5% betadine to ocular surface, 10% betadine to eyelids, Tobramycin 0.3%. A 30 gauge needle was used.   Injection: 2 mg aflibercept 2 MG/0.05ML   Route: Intravitreal, Site: Left Eye   NDC: [id number], Lot: [id number], Expiration date: 09/23/2022, Waste: 0 mL   Post-op Post injection exam found visual acuity of at least counting fingers. The patient tolerated the procedure well. There were no complications. The patient received written and verbal post procedure care education. Post injection medications included ocuflox.              ASSESSMENT/PLAN:  Exudative age-related macular degeneration of left eye with active choroidal neovascularization ([facility]) OS improved macular thickening and improved CME over the last 5 weeks.  Improving slowly on Eylea.  Goal is to prevent scotoma and growth and enlargement  Branch retinal vein occlusion with macular edema of left eye Superimposed upon macular findings  Cataract, nuclear sclerotic, both eyes No visual symptoms at this time.  Observe     ICD-10-CM   1. Branch retinal vein occlusion with macular edema of left eye  H34.8320 OCT, Retina - OU - Both Eyes    Intravitreal Injection, Pharmacologic Agent - OS - Left Eye    aflibercept (EYLEA) SOLN 2 mg    2. Exudative age-related macular  degeneration of left eye with active choroidal neovascularization (HCC)  H35.3221 Intravitreal Injection, Pharmacologic Agent - OS - Left Eye    aflibercept (EYLEA) SOLN 2 mg    3. Cataract, nuclear sclerotic, both eyes  H25.13       1.  OS patient reports some brightening of the vision.  This may be accompanied by the intraretinal fluid and CME slightly less now on intravitreal Eylea.  Repeat injection today and reevaluate again in 5 weeks  2.  3.  Ophthalmic Meds Ordered this visit:  Meds ordered this encounter  Medications   aflibercept (EYLEA) SOLN 2 mg       Return in about 5 weeks (around 02/19/2022) for dilate, EYLEA OCT.  There are no Patient Instructions on file for this visit.   Explained the diagnoses, plan, and follow up with the patient and they expressed understanding.  Patient expressed understanding of the importance of proper follow up care.   Clent Demark Sophiagrace Benbrook M.D. Diseases & Surgery of the Retina and Vitreous Retina & Diabetic [place] 01/15/22     Abbreviations: M myopia (nearsighted); A astigmatism; H hyperopia (farsighted); P presbyopia; Mrx spectacle prescription;  CTL contact lenses; OD right eye; OS left eye; OU both eyes  XT exotropia; ET esotropia; PEK punctate epithelial keratitis; PEE punctate epithelial erosions; DES dry eye syndrome; MGD meibomian gland dysfunction; ATs artificial tears; PFAT's preservative free artificial tears; [place] nuclear sclerotic cataract; PSC posterior subcapsular cataract; ERM epi-retinal membrane; PVD posterior vitreous detachment; RD retinal detachment; DM diabetes mellitus; DR diabetic retinopathy; NPDR non-proliferative diabetic retinopathy; PDR proliferative diabetic retinopathy; CSME clinically significant macular edema; DME diabetic macular edema; dbh dot blot hemorrhages; CWS cotton wool spot; POAG primary open angle glaucoma; C/D cup-to-disc ratio; HVF humphrey visual field; GVF goldmann visual field; OCT optical  coherence tomography; IOP intraocular  pressure; BRVO Branch retinal vein occlusion; CRVO central retinal vein occlusion; CRAO central retinal artery occlusion; BRAO branch retinal artery occlusion; RT retinal tear; SB scleral buckle; PPV pars plana vitrectomy; VH Vitreous hemorrhage; PRP panretinal laser photocoagulation; IVK intravitreal kenalog; VMT vitreomacular traction; MH Macular hole;  NVD neovascularization of the disc; NVE neovascularization elsewhere; AREDS age related eye disease study; ARMD age related macular degeneration; POAG primary open angle glaucoma; EBMD epithelial/anterior basement membrane dystrophy; ACIOL anterior chamber intraocular lens; IOL intraocular lens; PCIOL posterior chamber intraocular lens; Phaco/IOL phacoemulsification with intraocular lens placement; [place] photorefractive keratectomy; LASIK laser assisted in situ keratomileusis; HTN hypertension; DM diabetes mellitus; COPD chronic obstructive pulmonary disease

## 2022-01-15 NOTE — Assessment & Plan Note (Signed)
OS improved macular thickening and improved CME over the last 5 weeks.  Improving slowly on Eylea.  Goal is to prevent scotoma and growth and enlargement

## 2022-01-15 NOTE — Assessment & Plan Note (Signed)
Superimposed upon macular findings

## 2022-02-20 ENCOUNTER — Ambulatory Visit (INDEPENDENT_AMBULATORY_CARE_PROVIDER_SITE_OTHER): Payer: Medicare Other | Admitting: Ophthalmology

## 2022-02-20 ENCOUNTER — Encounter (INDEPENDENT_AMBULATORY_CARE_PROVIDER_SITE_OTHER): Payer: Self-pay | Admitting: Ophthalmology

## 2022-02-20 DIAGNOSIS — H43813 Vitreous degeneration, bilateral: Secondary | ICD-10-CM

## 2022-02-20 DIAGNOSIS — H353221 Exudative age-related macular degeneration, left eye, with active choroidal neovascularization: Secondary | ICD-10-CM | POA: Diagnosis not present

## 2022-02-20 DIAGNOSIS — H353132 Nonexudative age-related macular degeneration, bilateral, intermediate dry stage: Secondary | ICD-10-CM

## 2022-02-20 DIAGNOSIS — H34832 Tributary (branch) retinal vein occlusion, left eye, with macular edema: Secondary | ICD-10-CM | POA: Diagnosis not present

## 2022-02-20 MED ORDER — AFLIBERCEPT 2MG/0.05ML IZ SOLN FOR KALEIDOSCOPE
2.0000 mg | INTRAVITREAL | Status: AC | PRN
  Administered 2022-02-20: 2 mg via INTRAVITREAL

## 2022-02-20 NOTE — Assessment & Plan Note (Signed)
Stable from the past

## 2022-02-20 NOTE — Progress Notes (Signed)
02/20/2022     CHIEF COMPLAINT Patient presents for  Chief Complaint  Patient presents with   Macular Degeneration      HISTORY OF PRESENT ILLNESS: Carrie Osborne is a 81 y.o. female who presents to the clinic today for:   HPI   OS with wet AMD after recent large hemorrhagic PED develop temporal aspect of the macula May 2023.  Now resolved with antivegF with RPE rip and subfoveal scrolling of the RPE and scarring.  Overlying intraretinal fluid has continued to diminish over time.  5 weeks dilate eylea oct Pt states her vision has been stable Pt denies any new floaters but admits to FOL Last edited by Hurman Horn, MD on 02/20/2022 10:16 AM.      Referring physician: Celene Squibb, MD [place],  [place] [address]  HISTORICAL INFORMATION:   Selected notes from the [facility]: No current outpatient medications on file. (Ophthalmic Drugs)   No current facility-administered medications for this visit. (Ophthalmic Drugs)   Current Outpatient Medications (Other)  Medication Sig   Calcium Carbonate-Vitamin D (CALCIUM-VITAMIN D3 PO) Take 1 tablet by mouth 2 (two) times daily.   cetirizine (ZYRTEC) 10 MG tablet Take 10 mg by mouth daily.   Coenzyme Q10 (CO Q-10) 100 MG CAPS Take 100 mg by mouth daily.    hydrochlorothiazide (HYDRODIURIL) 25 MG tablet Take 1 tablet by mouth once daily   levothyroxine (SYNTHROID) 75 MCG tablet Take 1 tablet (75 mcg total) by mouth daily before breakfast.   lisinopril (ZESTRIL) 10 MG tablet Take 10 mg by mouth daily.   metoprolol succinate (TOPROL-XL) 100 MG 24 hr tablet Take 100 mg by mouth daily. Take with or immediately following a meal.   mometasone (NASONEX) 50 MCG/ACT nasal spray Place 1-2 sprays into the nose daily as needed (for allergies).    Multiple Vitamins-Minerals (CENTRUM SILVER PO) Take 1 tablet by mouth daily.   Multiple Vitamins-Minerals (PRESERVISION AREDS 2+MULTI VIT PO)  Take 2 capsules by mouth in the morning and at bedtime.   Na Sulfate-K Sulfate-Mg Sulf 17.5-3.13-1.6 GM/177ML SOLN Take 1 kit by mouth as directed.   Omega-3 Fatty Acids (FISH OIL) 1200 MG CAPS Take 1,200 mg by mouth daily.    simvastatin (ZOCOR) 40 MG tablet Take 40 mg by mouth at bedtime.    No current facility-administered medications for this visit. (Other)      REVIEW OF SYSTEMS: ROS   Negative for: Constitutional, Gastrointestinal, Neurological, Skin, Genitourinary, Musculoskeletal, HENT, Endocrine, Cardiovascular, Eyes, Respiratory, Psychiatric, Allergic/Imm, Heme/Lymph Last edited by Morene Rankins, CMA on 02/20/2022  9:45 AM.       ALLERGIES Allergies  Allergen Reactions   Penicillins Hives, Itching, Rash and Other (See Comments)    Has patient had a PCN reaction causing immediate rash, facial/tongue/throat swelling, SOB or lightheadedness with hypotension: No Has patient had a PCN reaction causing severe rash involving mucus membranes or skin necrosis: No Has patient had a PCN reaction that required hospitalization: No Has patient had a PCN reaction occurring within the last 10 years: Yes If all of the above answers are "NO", then may proceed with Cephalosporin use.     PAST MEDICAL HISTORY Past Medical History:  Diagnosis Date   Arthritis    Baker's cyst    Hypothyroidism    Osteoarthritis    Past Surgical History:  Procedure Laterality Date   ABDOMINAL HYSTERECTOMY  COLONOSCOPY N/A 07/04/2017   Procedure: COLONOSCOPY;  Surgeon: Daneil Dolin, MD;  Location: [facility];  Service: Endoscopy;  Laterality: N/A;  12:45pm   TONSILLECTOMY      FAMILY HISTORY Family History  Problem Relation Age of Onset   Colon cancer Neg Hx     SOCIAL HISTORY Social History   Tobacco Use   Smoking status: Never   Smokeless tobacco: Never  Vaping Use   Vaping Use: Never used  Substance Use Topics   Alcohol use: No   Drug use: No         OPHTHALMIC  EXAM:  Base Eye Exam     Visual Acuity (ETDRS)       Right Left   Dist cc 20/25 +2 CF at 3'    Correction: Glasses         Tonometry (Tonopen, 9:49 AM)       Right Left   Pressure 13 8         Neuro/Psych     Oriented x3: Yes   Mood/Affect: Normal         Dilation     Left eye: 2.5% Phenylephrine, 1.0% Mydriacyl @ 9:46 AM           Slit Lamp and Fundus Exam     External Exam       Right Left   External Normal Normal         Slit Lamp Exam       Right Left   Lids/Lashes Normal Normal   Conjunctiva/Sclera White and quiet White and quiet   Cornea Clear Clear   Anterior Chamber Deep and quiet Deep and quiet   Iris Round and reactive Round and reactive   Lens 3+ Nuclear sclerosis 3+ Nuclear sclerosis   Anterior Vitreous Normal Normal         Fundus Exam       Right Left   Posterior Vitreous  Posterior vitreous detachment old central vitreous floaters   Disc  Normal   C/D Ratio  0.55   Macula  Microaneurysms, Soft drusen, no exudates, no hemorrhage, Disciform scar with scrolled RPE detachment, Macular thickening, Retinal pigment epithelial atrophy, nodular excrescence of RPE rip, and residual RPE detachment   Vessels  Old branch retinal vein occlusion, with minor CME temporal to the fovea, not in the FAZ, no N/V   Periphery  Normal            IMAGING AND PROCEDURES  Imaging and Procedures for 02/20/22  OCT, Retina - OU - Both Eyes       Right Eye Quality was good. Scan locations included subfoveal. Central Foveal Thickness: 278. Progression has been stable. Findings include abnormal foveal contour, retinal drusen .   Left Eye Quality was good. Scan locations included subfoveal. Central Foveal Thickness: 691. Findings include abnormal foveal contour, retinal drusen , subretinal hyper-reflective material, cystoid macular edema, intraretinal fluid, pigment epithelial detachment, subretinal fluid.   Notes OD, no active maculopathy,     OS with history of macular BRVO postinjection for large subfoveal PED, now with evidence of an RPE rip, with RPE change and atrophy superior, and scrolled RPE subfoveal,increased subretinal fluid and increased intraretinal fluid, but in good vision OS, with much less intraretinal edema and CME, overall stable at 5 weeks post recent Eylea.  Repeat injection today much less intraretinal CME.  Vision limited by subfoveal scarring OS       Intravitreal Injection, Pharmacologic Agent - OS -  Left Eye       Time Out 02/20/2022. 10:17 AM. Confirmed correct patient, procedure, site, and patient consented.   Anesthesia Topical anesthesia was used. Anesthetic medications included Lidocaine 4%.   Procedure Preparation included 5% betadine to ocular surface, 10% betadine to eyelids, Tobramycin 0.3%. A 30 gauge needle was used.   Injection: 2 mg aflibercept 2 MG/0.05ML   Route: Intravitreal, Site: Left Eye   NDC: [id number], Lot: [id number], Expiration date: 09/23/2022, Waste: 0 mL   Post-op Post injection exam found visual acuity of at least counting fingers. The patient tolerated the procedure well. There were no complications. The patient received written and verbal post procedure care education. Post injection medications included ocuflox.              ASSESSMENT/PLAN:  Branch retinal vein occlusion with macular edema of left eye Stable from the past  Exudative age-related macular degeneration of left eye with active choroidal neovascularization (HCC) Vastly improved macular condition left eye resolved with an RPE rip and now disciform scarring centrally accounting for acuity but overall less intraretinal fluid will be able to repeat injection Eylea today and extend interval examination next in 9 to 10 weeks  Intermediate stage nonexudative age-related macular degeneration of both eyes No sign of CNVM OD  Posterior vitreous detachment of both eyes Physiologic OU      ICD-10-CM   1. Exudative age-related macular degeneration of left eye with active choroidal neovascularization (HCC)  H35.3221 Intravitreal Injection, Pharmacologic Agent - OS - Left Eye    aflibercept (EYLEA) SOLN 2 mg    2. Branch retinal vein occlusion with macular edema of left eye  H34.8320 OCT, Retina - OU - Both Eyes    Intravitreal Injection, Pharmacologic Agent - OS - Left Eye    aflibercept (EYLEA) SOLN 2 mg    3. Intermediate stage nonexudative age-related macular degeneration of both eyes  H35.3132     4. Posterior vitreous detachment of both eyes  H43.813       1.  OS vastly improved macular anatomy.  Stabilized acuity.  Permanent changes from disciform scar centrally.  Controlled on Eylea.  We will repeat again today with limited vision we will extend interval examination next  2.  OD stable, AMD.  No sign of CNVM  3.  Ophthalmic Meds Ordered this visit:  Meds ordered this encounter  Medications   aflibercept (EYLEA) SOLN 2 mg       Return in about 10 weeks (around 05/01/2022) for DILATE OU, EYLEA OCT, OS.  There are no Patient Instructions on file for this visit.   Explained the diagnoses, plan, and follow up with the patient and they expressed understanding.  Patient expressed understanding of the importance of proper follow up care.   Clent Demark Ayaka Andes M.D. Diseases & Surgery of the Retina and Vitreous [facility]ina [facility] Diabetic [place] 02/20/22     Abbreviations: M myopia (nearsighted); A astigmatism; H hyperopia (farsighted); P presbyopia; Mrx spectacle prescription;  CTL contact lenses; OD right eye; OS left eye; OU both eyes  XT exotropia; ET esotropia; PEK punctate epithelial keratitis; PEE punctate epithelial erosions; DES dry eye syndrome; MGD meibomian gland dysfunction; ATs artificial tears; PFAT's preservative free artificial tears; [place] nuclear sclerotic cataract; PSC posterior subcapsular cataract; ERM epi-retinal membrane; PVD posterior vitreous  detachment; RD retinal detachment; DM diabetes mellitus; DR diabetic retinopathy; NPDR non-proliferative diabetic retinopathy; PDR proliferative diabetic retinopathy; CSME clinically significant macular edema; DME diabetic macular edema; dbh dot blot hemorrhages; CWS  cotton wool spot; POAG primary open angle glaucoma; C/D cup-to-disc ratio; HVF humphrey visual field; GVF goldmann visual field; OCT optical coherence tomography; IOP intraocular pressure; BRVO Branch retinal vein occlusion; CRVO central retinal vein occlusion; CRAO central retinal artery occlusion; BRAO branch retinal artery occlusion; RT retinal tear; SB scleral buckle; PPV pars plana vitrectomy; VH Vitreous hemorrhage; PRP panretinal laser photocoagulation; IVK intravitreal kenalog; VMT vitreomacular traction; MH Macular hole;  NVD neovascularization of the disc; NVE neovascularization elsewhere; AREDS age related eye disease study; ARMD age related macular degeneration; POAG primary open angle glaucoma; EBMD epithelial/anterior basement membrane dystrophy; ACIOL anterior chamber intraocular lens; IOL intraocular lens; PCIOL posterior chamber intraocular lens; Phaco/IOL phacoemulsification with intraocular lens placement; [place] photorefractive keratectomy; LASIK laser assisted in situ keratomileusis; HTN hypertension; DM diabetes mellitus; COPD chronic obstructive pulmonary disease

## 2022-02-20 NOTE — Assessment & Plan Note (Signed)
Vastly improved macular condition left eye resolved with an RPE rip and now disciform scarring centrally accounting for acuity but overall less intraretinal fluid will be able to repeat injection Eylea today and extend interval examination next in 9 to 10 weeks

## 2022-02-20 NOTE — Assessment & Plan Note (Signed)
Physiologic OU 

## 2022-02-20 NOTE — Assessment & Plan Note (Signed)
No sign of CNVM OD 

## 2022-03-11 DIAGNOSIS — R21 Rash and other nonspecific skin eruption: Secondary | ICD-10-CM | POA: Diagnosis not present

## 2022-03-11 DIAGNOSIS — R202 Paresthesia of skin: Secondary | ICD-10-CM | POA: Diagnosis not present

## 2022-03-13 DIAGNOSIS — Z23 Encounter for immunization: Secondary | ICD-10-CM | POA: Diagnosis not present

## 2022-04-01 DIAGNOSIS — B078 Other viral warts: Secondary | ICD-10-CM | POA: Diagnosis not present

## 2022-04-01 DIAGNOSIS — L570 Actinic keratosis: Secondary | ICD-10-CM | POA: Diagnosis not present

## 2022-04-01 DIAGNOSIS — X32XXXD Exposure to sunlight, subsequent encounter: Secondary | ICD-10-CM | POA: Diagnosis not present

## 2022-05-01 ENCOUNTER — Encounter (INDEPENDENT_AMBULATORY_CARE_PROVIDER_SITE_OTHER): Payer: Medicare Other | Admitting: Ophthalmology

## 2022-05-01 DIAGNOSIS — H43813 Vitreous degeneration, bilateral: Secondary | ICD-10-CM | POA: Diagnosis not present

## 2022-05-01 DIAGNOSIS — H353132 Nonexudative age-related macular degeneration, bilateral, intermediate dry stage: Secondary | ICD-10-CM | POA: Diagnosis not present

## 2022-05-01 DIAGNOSIS — H353221 Exudative age-related macular degeneration, left eye, with active choroidal neovascularization: Secondary | ICD-10-CM | POA: Diagnosis not present

## 2022-05-01 DIAGNOSIS — H34832 Tributary (branch) retinal vein occlusion, left eye, with macular edema: Secondary | ICD-10-CM | POA: Diagnosis not present

## 2022-05-01 DIAGNOSIS — H353211 Exudative age-related macular degeneration, right eye, with active choroidal neovascularization: Secondary | ICD-10-CM | POA: Diagnosis not present

## 2022-05-01 DIAGNOSIS — H2513 Age-related nuclear cataract, bilateral: Secondary | ICD-10-CM | POA: Diagnosis not present

## 2022-05-01 DIAGNOSIS — H35722 Serous detachment of retinal pigment epithelium, left eye: Secondary | ICD-10-CM | POA: Diagnosis not present

## 2022-05-01 DIAGNOSIS — H348312 Tributary (branch) retinal vein occlusion, right eye, stable: Secondary | ICD-10-CM | POA: Diagnosis not present

## 2022-05-01 DIAGNOSIS — H348321 Tributary (branch) retinal vein occlusion, left eye, with retinal neovascularization: Secondary | ICD-10-CM | POA: Diagnosis not present

## 2022-05-09 DIAGNOSIS — H35722 Serous detachment of retinal pigment epithelium, left eye: Secondary | ICD-10-CM | POA: Diagnosis not present

## 2022-05-09 DIAGNOSIS — H353221 Exudative age-related macular degeneration, left eye, with active choroidal neovascularization: Secondary | ICD-10-CM | POA: Diagnosis not present

## 2022-05-09 DIAGNOSIS — H353132 Nonexudative age-related macular degeneration, bilateral, intermediate dry stage: Secondary | ICD-10-CM | POA: Diagnosis not present

## 2022-05-09 DIAGNOSIS — H2513 Age-related nuclear cataract, bilateral: Secondary | ICD-10-CM | POA: Diagnosis not present

## 2022-05-09 DIAGNOSIS — H353211 Exudative age-related macular degeneration, right eye, with active choroidal neovascularization: Secondary | ICD-10-CM | POA: Diagnosis not present

## 2022-05-09 DIAGNOSIS — H34832 Tributary (branch) retinal vein occlusion, left eye, with macular edema: Secondary | ICD-10-CM | POA: Diagnosis not present

## 2022-05-09 DIAGNOSIS — H43813 Vitreous degeneration, bilateral: Secondary | ICD-10-CM | POA: Diagnosis not present

## 2022-05-14 DIAGNOSIS — E782 Mixed hyperlipidemia: Secondary | ICD-10-CM | POA: Diagnosis not present

## 2022-05-14 DIAGNOSIS — E559 Vitamin D deficiency, unspecified: Secondary | ICD-10-CM | POA: Diagnosis not present

## 2022-05-14 DIAGNOSIS — E039 Hypothyroidism, unspecified: Secondary | ICD-10-CM | POA: Diagnosis not present

## 2022-05-14 DIAGNOSIS — R7301 Impaired fasting glucose: Secondary | ICD-10-CM | POA: Diagnosis not present

## 2022-05-21 DIAGNOSIS — E039 Hypothyroidism, unspecified: Secondary | ICD-10-CM | POA: Diagnosis not present

## 2022-05-21 DIAGNOSIS — E782 Mixed hyperlipidemia: Secondary | ICD-10-CM | POA: Diagnosis not present

## 2022-05-21 DIAGNOSIS — R7301 Impaired fasting glucose: Secondary | ICD-10-CM | POA: Diagnosis not present

## 2022-05-21 DIAGNOSIS — H353132 Nonexudative age-related macular degeneration, bilateral, intermediate dry stage: Secondary | ICD-10-CM | POA: Diagnosis not present

## 2022-05-21 DIAGNOSIS — M5431 Sciatica, right side: Secondary | ICD-10-CM | POA: Diagnosis not present

## 2022-05-21 DIAGNOSIS — I10 Essential (primary) hypertension: Secondary | ICD-10-CM | POA: Diagnosis not present

## 2022-05-21 DIAGNOSIS — E559 Vitamin D deficiency, unspecified: Secondary | ICD-10-CM | POA: Diagnosis not present

## 2022-05-21 DIAGNOSIS — H7292 Unspecified perforation of tympanic membrane, left ear: Secondary | ICD-10-CM | POA: Diagnosis not present

## 2022-05-21 DIAGNOSIS — J302 Other seasonal allergic rhinitis: Secondary | ICD-10-CM | POA: Diagnosis not present

## 2022-05-21 DIAGNOSIS — Z0000 Encounter for general adult medical examination without abnormal findings: Secondary | ICD-10-CM | POA: Diagnosis not present

## 2022-06-12 DIAGNOSIS — H353211 Exudative age-related macular degeneration, right eye, with active choroidal neovascularization: Secondary | ICD-10-CM | POA: Diagnosis not present

## 2022-06-12 DIAGNOSIS — H3581 Retinal edema: Secondary | ICD-10-CM | POA: Diagnosis not present

## 2022-06-12 DIAGNOSIS — H2513 Age-related nuclear cataract, bilateral: Secondary | ICD-10-CM | POA: Diagnosis not present

## 2022-06-12 DIAGNOSIS — H35722 Serous detachment of retinal pigment epithelium, left eye: Secondary | ICD-10-CM | POA: Diagnosis not present

## 2022-06-12 DIAGNOSIS — H353131 Nonexudative age-related macular degeneration, bilateral, early dry stage: Secondary | ICD-10-CM | POA: Diagnosis not present

## 2022-06-20 DIAGNOSIS — H353132 Nonexudative age-related macular degeneration, bilateral, intermediate dry stage: Secondary | ICD-10-CM | POA: Diagnosis not present

## 2022-06-20 DIAGNOSIS — H353221 Exudative age-related macular degeneration, left eye, with active choroidal neovascularization: Secondary | ICD-10-CM | POA: Diagnosis not present

## 2022-06-20 DIAGNOSIS — H34832 Tributary (branch) retinal vein occlusion, left eye, with macular edema: Secondary | ICD-10-CM | POA: Diagnosis not present

## 2022-06-20 DIAGNOSIS — H43813 Vitreous degeneration, bilateral: Secondary | ICD-10-CM | POA: Diagnosis not present

## 2022-06-20 DIAGNOSIS — H348312 Tributary (branch) retinal vein occlusion, right eye, stable: Secondary | ICD-10-CM | POA: Diagnosis not present

## 2022-06-20 DIAGNOSIS — H2513 Age-related nuclear cataract, bilateral: Secondary | ICD-10-CM | POA: Diagnosis not present

## 2022-06-20 DIAGNOSIS — H348321 Tributary (branch) retinal vein occlusion, left eye, with retinal neovascularization: Secondary | ICD-10-CM | POA: Diagnosis not present

## 2022-06-20 DIAGNOSIS — H35722 Serous detachment of retinal pigment epithelium, left eye: Secondary | ICD-10-CM | POA: Diagnosis not present

## 2022-06-20 DIAGNOSIS — H353211 Exudative age-related macular degeneration, right eye, with active choroidal neovascularization: Secondary | ICD-10-CM | POA: Diagnosis not present

## 2022-06-26 DIAGNOSIS — H2513 Age-related nuclear cataract, bilateral: Secondary | ICD-10-CM | POA: Diagnosis not present

## 2022-06-26 DIAGNOSIS — H353221 Exudative age-related macular degeneration, left eye, with active choroidal neovascularization: Secondary | ICD-10-CM | POA: Diagnosis not present

## 2022-06-26 DIAGNOSIS — H34832 Tributary (branch) retinal vein occlusion, left eye, with macular edema: Secondary | ICD-10-CM | POA: Diagnosis not present

## 2022-06-26 DIAGNOSIS — H353132 Nonexudative age-related macular degeneration, bilateral, intermediate dry stage: Secondary | ICD-10-CM | POA: Diagnosis not present

## 2022-06-26 DIAGNOSIS — H348312 Tributary (branch) retinal vein occlusion, right eye, stable: Secondary | ICD-10-CM | POA: Diagnosis not present

## 2022-06-26 DIAGNOSIS — H35722 Serous detachment of retinal pigment epithelium, left eye: Secondary | ICD-10-CM | POA: Diagnosis not present

## 2022-06-26 DIAGNOSIS — H353211 Exudative age-related macular degeneration, right eye, with active choroidal neovascularization: Secondary | ICD-10-CM | POA: Diagnosis not present

## 2022-07-24 DIAGNOSIS — H348312 Tributary (branch) retinal vein occlusion, right eye, stable: Secondary | ICD-10-CM | POA: Diagnosis not present

## 2022-07-24 DIAGNOSIS — H353211 Exudative age-related macular degeneration, right eye, with active choroidal neovascularization: Secondary | ICD-10-CM | POA: Diagnosis not present

## 2022-07-24 DIAGNOSIS — H2513 Age-related nuclear cataract, bilateral: Secondary | ICD-10-CM | POA: Diagnosis not present

## 2022-07-24 DIAGNOSIS — H353221 Exudative age-related macular degeneration, left eye, with active choroidal neovascularization: Secondary | ICD-10-CM | POA: Diagnosis not present

## 2022-07-24 DIAGNOSIS — H43813 Vitreous degeneration, bilateral: Secondary | ICD-10-CM | POA: Diagnosis not present

## 2022-07-24 DIAGNOSIS — H35722 Serous detachment of retinal pigment epithelium, left eye: Secondary | ICD-10-CM | POA: Diagnosis not present

## 2022-07-24 DIAGNOSIS — H353132 Nonexudative age-related macular degeneration, bilateral, intermediate dry stage: Secondary | ICD-10-CM | POA: Diagnosis not present

## 2022-07-24 DIAGNOSIS — H348321 Tributary (branch) retinal vein occlusion, left eye, with retinal neovascularization: Secondary | ICD-10-CM | POA: Diagnosis not present

## 2022-08-07 DIAGNOSIS — H35722 Serous detachment of retinal pigment epithelium, left eye: Secondary | ICD-10-CM | POA: Diagnosis not present

## 2022-08-07 DIAGNOSIS — H34832 Tributary (branch) retinal vein occlusion, left eye, with macular edema: Secondary | ICD-10-CM | POA: Diagnosis not present

## 2022-08-07 DIAGNOSIS — H43813 Vitreous degeneration, bilateral: Secondary | ICD-10-CM | POA: Diagnosis not present

## 2022-08-07 DIAGNOSIS — H2513 Age-related nuclear cataract, bilateral: Secondary | ICD-10-CM | POA: Diagnosis not present

## 2022-08-28 DIAGNOSIS — H35722 Serous detachment of retinal pigment epithelium, left eye: Secondary | ICD-10-CM | POA: Diagnosis not present

## 2022-08-28 DIAGNOSIS — H353221 Exudative age-related macular degeneration, left eye, with active choroidal neovascularization: Secondary | ICD-10-CM | POA: Diagnosis not present

## 2022-08-28 DIAGNOSIS — H353211 Exudative age-related macular degeneration, right eye, with active choroidal neovascularization: Secondary | ICD-10-CM | POA: Diagnosis not present

## 2022-08-28 DIAGNOSIS — H2513 Age-related nuclear cataract, bilateral: Secondary | ICD-10-CM | POA: Diagnosis not present

## 2022-08-28 DIAGNOSIS — H34832 Tributary (branch) retinal vein occlusion, left eye, with macular edema: Secondary | ICD-10-CM | POA: Diagnosis not present

## 2022-08-28 DIAGNOSIS — H43813 Vitreous degeneration, bilateral: Secondary | ICD-10-CM | POA: Diagnosis not present

## 2022-09-19 DIAGNOSIS — H353132 Nonexudative age-related macular degeneration, bilateral, intermediate dry stage: Secondary | ICD-10-CM | POA: Diagnosis not present

## 2022-09-19 DIAGNOSIS — H2513 Age-related nuclear cataract, bilateral: Secondary | ICD-10-CM | POA: Diagnosis not present

## 2022-09-19 DIAGNOSIS — H35722 Serous detachment of retinal pigment epithelium, left eye: Secondary | ICD-10-CM | POA: Diagnosis not present

## 2022-09-19 DIAGNOSIS — H353221 Exudative age-related macular degeneration, left eye, with active choroidal neovascularization: Secondary | ICD-10-CM | POA: Diagnosis not present

## 2022-09-19 DIAGNOSIS — H353211 Exudative age-related macular degeneration, right eye, with active choroidal neovascularization: Secondary | ICD-10-CM | POA: Diagnosis not present

## 2022-09-19 DIAGNOSIS — H34832 Tributary (branch) retinal vein occlusion, left eye, with macular edema: Secondary | ICD-10-CM | POA: Diagnosis not present

## 2022-10-02 DIAGNOSIS — H353211 Exudative age-related macular degeneration, right eye, with active choroidal neovascularization: Secondary | ICD-10-CM | POA: Diagnosis not present

## 2022-10-02 DIAGNOSIS — H353132 Nonexudative age-related macular degeneration, bilateral, intermediate dry stage: Secondary | ICD-10-CM | POA: Diagnosis not present

## 2022-10-02 DIAGNOSIS — H353221 Exudative age-related macular degeneration, left eye, with active choroidal neovascularization: Secondary | ICD-10-CM | POA: Diagnosis not present

## 2022-10-02 DIAGNOSIS — H2513 Age-related nuclear cataract, bilateral: Secondary | ICD-10-CM | POA: Diagnosis not present

## 2022-10-02 DIAGNOSIS — H43813 Vitreous degeneration, bilateral: Secondary | ICD-10-CM | POA: Diagnosis not present

## 2022-10-31 DIAGNOSIS — H34832 Tributary (branch) retinal vein occlusion, left eye, with macular edema: Secondary | ICD-10-CM | POA: Diagnosis not present

## 2022-10-31 DIAGNOSIS — H353221 Exudative age-related macular degeneration, left eye, with active choroidal neovascularization: Secondary | ICD-10-CM | POA: Diagnosis not present

## 2022-10-31 DIAGNOSIS — H348322 Tributary (branch) retinal vein occlusion, left eye, stable: Secondary | ICD-10-CM | POA: Diagnosis not present

## 2022-10-31 DIAGNOSIS — H348312 Tributary (branch) retinal vein occlusion, right eye, stable: Secondary | ICD-10-CM | POA: Diagnosis not present

## 2022-10-31 DIAGNOSIS — H43813 Vitreous degeneration, bilateral: Secondary | ICD-10-CM | POA: Diagnosis not present

## 2022-10-31 DIAGNOSIS — H353211 Exudative age-related macular degeneration, right eye, with active choroidal neovascularization: Secondary | ICD-10-CM | POA: Diagnosis not present

## 2022-10-31 DIAGNOSIS — H2513 Age-related nuclear cataract, bilateral: Secondary | ICD-10-CM | POA: Diagnosis not present

## 2022-10-31 DIAGNOSIS — H353132 Nonexudative age-related macular degeneration, bilateral, intermediate dry stage: Secondary | ICD-10-CM | POA: Diagnosis not present

## 2022-11-06 ENCOUNTER — Emergency Department (HOSPITAL_COMMUNITY)
Admission: EM | Admit: 2022-11-06 | Discharge: 2022-11-06 | Disposition: A | Discharge status: Home / Self Care | Payer: No Typology Code available for payment source | Attending: Emergency Medicine | Admitting: Emergency Medicine

## 2022-11-06 ENCOUNTER — Emergency Department (HOSPITAL_COMMUNITY): Payer: No Typology Code available for payment source

## 2022-11-06 DIAGNOSIS — M542 Cervicalgia: Secondary | ICD-10-CM | POA: Insufficient documentation

## 2022-11-06 DIAGNOSIS — M48061 Spinal stenosis, lumbar region without neurogenic claudication: Secondary | ICD-10-CM | POA: Diagnosis not present

## 2022-11-06 DIAGNOSIS — M545 Low back pain, unspecified: Secondary | ICD-10-CM | POA: Diagnosis not present

## 2022-11-06 DIAGNOSIS — M4807 Spinal stenosis, lumbosacral region: Secondary | ICD-10-CM | POA: Diagnosis not present

## 2022-11-06 DIAGNOSIS — Y92410 Unspecified street and highway as the place of occurrence of the external cause: Secondary | ICD-10-CM | POA: Insufficient documentation

## 2022-11-06 DIAGNOSIS — S32009A Unspecified fracture of unspecified lumbar vertebra, initial encounter for closed fracture: Secondary | ICD-10-CM

## 2022-11-06 DIAGNOSIS — M47812 Spondylosis without myelopathy or radiculopathy, cervical region: Secondary | ICD-10-CM | POA: Diagnosis not present

## 2022-11-06 DIAGNOSIS — M8588 Other specified disorders of bone density and structure, other site: Secondary | ICD-10-CM | POA: Diagnosis not present

## 2022-11-06 DIAGNOSIS — M5136 Other intervertebral disc degeneration, lumbar region: Secondary | ICD-10-CM | POA: Diagnosis not present

## 2022-11-06 DIAGNOSIS — S32010A Wedge compression fracture of first lumbar vertebra, initial encounter for closed fracture: Secondary | ICD-10-CM | POA: Diagnosis not present

## 2022-11-06 LAB — COMPREHENSIVE METABOLIC PANEL
ALT: 19 U/L (ref 0–44)
AST: 30 U/L (ref 15–41)
Albumin: 3.9 g/dL (ref 3.5–5.0)
Alkaline Phosphatase: 76 U/L (ref 38–126)
Anion gap: 9 (ref 5–15)
BUN: 14 mg/dL (ref 8–23)
CO2: 25 mmol/L (ref 22–32)
Calcium: 9 mg/dL (ref 8.9–10.3)
Chloride: 96 mmol/L — ABNORMAL LOW (ref 98–111)
Creatinine, Ser: 0.69 mg/dL (ref 0.44–1.00)
GFR, Estimated: >60 mL/min (ref >60)
Glucose, Bld: 174 mg/dL — ABNORMAL HIGH (ref 70–99)
Potassium: 3.3 mmol/L — ABNORMAL LOW (ref 3.5–5.1)
Sodium: 130 mmol/L — ABNORMAL LOW (ref 135–145)
Total Bilirubin: 2.1 mg/dL — ABNORMAL HIGH (ref 0.3–1.2)
Total Protein: 6.8 g/dL (ref 6.5–8.1)

## 2022-11-06 LAB — CBC
HCT: 42.8 % (ref 36.0–46.0)
Hemoglobin: 14.7 g/dL (ref 12.0–15.0)
MCH: 31.1 pg (ref 26.0–34.0)
MCHC: 34.3 g/dL (ref 30.0–36.0)
MCV: 90.5 fL (ref 80.0–100.0)
Platelets: 299 10*3/uL (ref 150–400)
RBC: 4.73 MIL/uL (ref 3.87–5.11)
RDW: 12 % (ref 11.5–15.5)
WBC: 13.9 10*3/uL — ABNORMAL HIGH (ref 4.0–10.5)
nRBC: 0 % (ref 0.0–0.2)

## 2022-11-06 LAB — SAMPLE TO BLOOD BANK

## 2022-11-06 LAB — LACTIC ACID, PLASMA: Lactic Acid, Venous: 1.8 mmol/L (ref 0.5–1.9)

## 2022-11-06 LAB — TROPONIN I (HIGH SENSITIVITY): Troponin I (High Sensitivity): 3 ng/L (ref <18)

## 2022-11-06 LAB — CBG MONITORING, ED: Glucose-Capillary: 178 mg/dL — ABNORMAL HIGH (ref 70–99)

## 2022-11-06 MED ORDER — ONDANSETRON HCL 4 MG/2ML IJ SOLN
4.0000 mg | Freq: Once | INTRAMUSCULAR | Status: AC
  Administered 2022-11-06: 4 mg via INTRAVENOUS
  Filled 2022-11-06: qty 2

## 2022-11-06 MED ORDER — ONDANSETRON 4 MG PO TBDP
4.0000 mg | ORAL_TABLET | Freq: Once | ORAL | Status: AC
  Administered 2022-11-06: 4 mg via ORAL
  Filled 2022-11-06: qty 1

## 2022-11-06 MED ORDER — HYDROCODONE-ACETAMINOPHEN 5-325 MG PO TABS
1.0000 | ORAL_TABLET | Freq: Once | ORAL | Status: AC
  Administered 2022-11-06: 1 via ORAL
  Filled 2022-11-06: qty 1

## 2022-11-06 MED ORDER — POTASSIUM CHLORIDE CRYS ER 20 MEQ PO TBCR
40.0000 meq | EXTENDED_RELEASE_TABLET | Freq: Once | ORAL | Status: AC
  Administered 2022-11-06: 40 meq via ORAL
  Filled 2022-11-06: qty 2

## 2022-11-06 MED ORDER — TIZANIDINE HCL 4 MG PO TABS
4.0000 mg | ORAL_TABLET | Freq: Once | ORAL | Status: AC
  Administered 2022-11-06: 4 mg via ORAL
  Filled 2022-11-06: qty 1

## 2022-11-06 MED ORDER — ACETAMINOPHEN 500 MG PO TABS: 500.0000 mg | ORAL_TABLET | Freq: Four times a day (QID) | ORAL | 0 refills | Status: AC | PRN

## 2022-11-06 MED ORDER — ACETAMINOPHEN 325 MG PO TABS
650.0000 mg | ORAL_TABLET | Freq: Once | ORAL | Status: AC
  Administered 2022-11-06: 650 mg via ORAL
  Filled 2022-11-06: qty 2

## 2022-11-06 MED ORDER — OXYCODONE-ACETAMINOPHEN 5-325 MG PO TABS: 1.0000 | ORAL_TABLET | Freq: Two times a day (BID) | ORAL | 0 refills | Status: DC | PRN

## 2022-11-06 MED ORDER — LACTATED RINGERS IV BOLUS
500.0000 mL | Freq: Once | INTRAVENOUS | Status: AC
  Administered 2022-11-06: 500 mL via INTRAVENOUS

## 2022-11-06 MED ORDER — TIZANIDINE HCL 2 MG PO CAPS: 2.0000 mg | ORAL_CAPSULE | Freq: Two times a day (BID) | ORAL | 0 refills | Status: DC | PRN
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
## 2022-11-06 NOTE — ED Notes (Signed)
Pt resting. No complaints at this time other than she is hungry.
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
## 2022-11-06 NOTE — ED Notes (Signed)
Pt provided with saltine crackers and a ginger ale.

## 2022-11-06 NOTE — ED Notes (Signed)
Pt son called out to let this nurse know that pt was feeling nauseous and dizzy. Before this nurse could get to the room patient passed out and fell backwards on the bed. Pt urinated on self. Provider to bedside. Pt will wake up and talk but is very lethargic.
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
## 2022-11-20 DIAGNOSIS — S32009D Unspecified fracture of unspecified lumbar vertebra, subsequent encounter for fracture with routine healing: Secondary | ICD-10-CM | POA: Diagnosis not present

## 2022-11-22 ENCOUNTER — Other Ambulatory Visit: Payer: Self-pay | Admitting: Orthopedic Surgery

## 2022-11-22 DIAGNOSIS — S32010A Wedge compression fracture of first lumbar vertebra, initial encounter for closed fracture: Secondary | ICD-10-CM
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
## 2022-11-28 ENCOUNTER — Ambulatory Visit
Admission: RE | Admit: 2022-11-28 | Discharge: 2022-11-28 | Disposition: A | Discharge status: Home / Self Care | Payer: Medicare Other | Source: Ambulatory Visit | Attending: Orthopedic Surgery | Admitting: Orthopedic Surgery

## 2022-11-28 ENCOUNTER — Encounter: Payer: Self-pay | Admitting: Orthopedic Surgery

## 2022-11-28 ENCOUNTER — Ambulatory Visit (INDEPENDENT_AMBULATORY_CARE_PROVIDER_SITE_OTHER): Payer: Medicare Other | Admitting: Orthopedic Surgery

## 2022-11-28 ENCOUNTER — Ambulatory Visit
Admission: RE | Admit: 2022-11-28 | Discharge: 2022-11-28 | Disposition: A | Discharge status: Home / Self Care | Payer: Medicare Other | Attending: Orthopedic Surgery | Admitting: Orthopedic Surgery

## 2022-11-28 VITALS — BP 126/76 | Ht 63.0 in | Wt 176.0 lb

## 2022-11-28 DIAGNOSIS — S32010A Wedge compression fracture of first lumbar vertebra, initial encounter for closed fracture: Secondary | ICD-10-CM | POA: Diagnosis not present

## 2022-11-28 DIAGNOSIS — M4316 Spondylolisthesis, lumbar region: Secondary | ICD-10-CM | POA: Diagnosis not present

## 2022-11-28 MED ORDER — TIZANIDINE HCL 2 MG PO CAPS
2.0000 mg | ORAL_CAPSULE | Freq: Two times a day (BID) | ORAL | 0 refills | Status: DC | PRN
Start: 2022-11-28 — End: 2023-01-15

## 2022-11-28 NOTE — Patient Instructions (Signed)
It was so nice to see you today. Thank you so much for coming in.    You have a broken bone at L1. This should heal over the next 3 months.   Wear the brace when you are up and walking. No bending, twisting, or lifting.   I sent a refill of tizanidine to help with muscle spasms. Use only as needed and be careful, this can make you sleepy.   I will see you back in 5 weeks. You will need to get xrays prior to that visit like you did today. Please do not hesitate to call if you have any questions or concerns. You can also message me in MyChart.   If you decide you want to have the procedure done with cement, then let me know.   Drake Leach PA-C [phone]

## 2022-12-12 DIAGNOSIS — H34832 Tributary (branch) retinal vein occlusion, left eye, with macular edema: Secondary | ICD-10-CM | POA: Diagnosis not present

## 2022-12-12 DIAGNOSIS — H348312 Tributary (branch) retinal vein occlusion, right eye, stable: Secondary | ICD-10-CM | POA: Diagnosis not present

## 2022-12-12 DIAGNOSIS — H348322 Tributary (branch) retinal vein occlusion, left eye, stable: Secondary | ICD-10-CM | POA: Diagnosis not present

## 2022-12-12 DIAGNOSIS — H2513 Age-related nuclear cataract, bilateral: Secondary | ICD-10-CM | POA: Diagnosis not present

## 2022-12-12 DIAGNOSIS — H43813 Vitreous degeneration, bilateral: Secondary | ICD-10-CM | POA: Diagnosis not present

## 2022-12-12 DIAGNOSIS — H353132 Nonexudative age-related macular degeneration, bilateral, intermediate dry stage: Secondary | ICD-10-CM | POA: Diagnosis not present

## 2022-12-12 DIAGNOSIS — H353221 Exudative age-related macular degeneration, left eye, with active choroidal neovascularization: Secondary | ICD-10-CM | POA: Diagnosis not present

## 2022-12-12 DIAGNOSIS — H353211 Exudative age-related macular degeneration, right eye, with active choroidal neovascularization: Secondary | ICD-10-CM | POA: Diagnosis not present

## 2022-12-19 DIAGNOSIS — H2513 Age-related nuclear cataract, bilateral: Secondary | ICD-10-CM | POA: Diagnosis not present

## 2022-12-19 DIAGNOSIS — H43813 Vitreous degeneration, bilateral: Secondary | ICD-10-CM | POA: Diagnosis not present

## 2022-12-19 DIAGNOSIS — H34832 Tributary (branch) retinal vein occlusion, left eye, with macular edema: Secondary | ICD-10-CM | POA: Diagnosis not present

## 2022-12-19 DIAGNOSIS — H348312 Tributary (branch) retinal vein occlusion, right eye, stable: Secondary | ICD-10-CM | POA: Diagnosis not present

## 2022-12-19 DIAGNOSIS — H353221 Exudative age-related macular degeneration, left eye, with active choroidal neovascularization: Secondary | ICD-10-CM | POA: Diagnosis not present

## 2022-12-19 DIAGNOSIS — H353211 Exudative age-related macular degeneration, right eye, with active choroidal neovascularization: Secondary | ICD-10-CM | POA: Diagnosis not present

## 2022-12-19 DIAGNOSIS — H353132 Nonexudative age-related macular degeneration, bilateral, intermediate dry stage: Secondary | ICD-10-CM | POA: Diagnosis not present

## 2022-12-19 DIAGNOSIS — H348322 Tributary (branch) retinal vein occlusion, left eye, stable: Secondary | ICD-10-CM | POA: Diagnosis not present

## 2023-01-10 ENCOUNTER — Other Ambulatory Visit: Payer: Self-pay | Admitting: Orthopedic Surgery

## 2023-01-10 DIAGNOSIS — S32010A Wedge compression fracture of first lumbar vertebra, initial encounter for closed fracture: Secondary | ICD-10-CM
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
## 2023-01-13 DIAGNOSIS — E782 Mixed hyperlipidemia: Secondary | ICD-10-CM | POA: Diagnosis not present

## 2023-01-13 DIAGNOSIS — E039 Hypothyroidism, unspecified: Secondary | ICD-10-CM | POA: Diagnosis not present

## 2023-01-13 DIAGNOSIS — E559 Vitamin D deficiency, unspecified: Secondary | ICD-10-CM | POA: Diagnosis not present

## 2023-01-13 DIAGNOSIS — R7301 Impaired fasting glucose: Secondary | ICD-10-CM | POA: Diagnosis not present

## 2023-01-15 ENCOUNTER — Ambulatory Visit
Admission: RE | Admit: 2023-01-15 | Discharge: 2023-01-15 | Disposition: A | Discharge status: Home / Self Care | Payer: Medicare Other | Source: Ambulatory Visit | Attending: Orthopedic Surgery | Admitting: Orthopedic Surgery

## 2023-01-15 ENCOUNTER — Encounter: Payer: Self-pay | Admitting: Orthopedic Surgery

## 2023-01-15 ENCOUNTER — Ambulatory Visit (INDEPENDENT_AMBULATORY_CARE_PROVIDER_SITE_OTHER): Payer: Medicare Other | Admitting: Orthopedic Surgery

## 2023-01-15 ENCOUNTER — Ambulatory Visit
Admission: RE | Admit: 2023-01-15 | Discharge: 2023-01-15 | Disposition: A | Discharge status: Home / Self Care | Payer: Medicare Other | Attending: Orthopedic Surgery | Admitting: Orthopedic Surgery

## 2023-01-15 VITALS — BP 112/62 | Wt 152.6 lb

## 2023-01-15 DIAGNOSIS — M47814 Spondylosis without myelopathy or radiculopathy, thoracic region: Secondary | ICD-10-CM | POA: Diagnosis not present

## 2023-01-15 DIAGNOSIS — M4856XA Collapsed vertebra, not elsewhere classified, lumbar region, initial encounter for fracture: Secondary | ICD-10-CM | POA: Diagnosis not present

## 2023-01-15 DIAGNOSIS — S32010A Wedge compression fracture of first lumbar vertebra, initial encounter for closed fracture: Secondary | ICD-10-CM

## 2023-01-15 DIAGNOSIS — S32010D Wedge compression fracture of first lumbar vertebra, subsequent encounter for fracture with routine healing: Secondary | ICD-10-CM | POA: Diagnosis not present

## 2023-01-15 DIAGNOSIS — M47816 Spondylosis without myelopathy or radiculopathy, lumbar region: Secondary | ICD-10-CM | POA: Diagnosis not present

## 2023-01-17 DIAGNOSIS — I10 Essential (primary) hypertension: Secondary | ICD-10-CM | POA: Diagnosis not present

## 2023-01-17 DIAGNOSIS — R7301 Impaired fasting glucose: Secondary | ICD-10-CM | POA: Diagnosis not present

## 2023-01-17 DIAGNOSIS — I700 Atherosclerosis of aorta: Secondary | ICD-10-CM | POA: Diagnosis not present

## 2023-01-17 DIAGNOSIS — E782 Mixed hyperlipidemia: Secondary | ICD-10-CM | POA: Diagnosis not present

## 2023-01-17 DIAGNOSIS — M5431 Sciatica, right side: Secondary | ICD-10-CM | POA: Diagnosis not present

## 2023-01-17 DIAGNOSIS — J302 Other seasonal allergic rhinitis: Secondary | ICD-10-CM | POA: Diagnosis not present

## 2023-01-17 DIAGNOSIS — E559 Vitamin D deficiency, unspecified: Secondary | ICD-10-CM | POA: Diagnosis not present

## 2023-01-17 DIAGNOSIS — H7290 Unspecified perforation of tympanic membrane, unspecified ear: Secondary | ICD-10-CM | POA: Diagnosis not present

## 2023-01-17 DIAGNOSIS — S32009D Unspecified fracture of unspecified lumbar vertebra, subsequent encounter for fracture with routine healing: Secondary | ICD-10-CM | POA: Diagnosis not present

## 2023-01-17 DIAGNOSIS — E039 Hypothyroidism, unspecified: Secondary | ICD-10-CM | POA: Diagnosis not present

## 2023-01-23 DIAGNOSIS — H348322 Tributary (branch) retinal vein occlusion, left eye, stable: Secondary | ICD-10-CM | POA: Diagnosis not present

## 2023-01-23 DIAGNOSIS — H348312 Tributary (branch) retinal vein occlusion, right eye, stable: Secondary | ICD-10-CM | POA: Diagnosis not present

## 2023-01-23 DIAGNOSIS — H353221 Exudative age-related macular degeneration, left eye, with active choroidal neovascularization: Secondary | ICD-10-CM | POA: Diagnosis not present

## 2023-01-23 DIAGNOSIS — H43813 Vitreous degeneration, bilateral: Secondary | ICD-10-CM | POA: Diagnosis not present

## 2023-01-23 DIAGNOSIS — H353211 Exudative age-related macular degeneration, right eye, with active choroidal neovascularization: Secondary | ICD-10-CM | POA: Diagnosis not present

## 2023-01-23 DIAGNOSIS — H2513 Age-related nuclear cataract, bilateral: Secondary | ICD-10-CM | POA: Diagnosis not present

## 2023-01-23 DIAGNOSIS — H35722 Serous detachment of retinal pigment epithelium, left eye: Secondary | ICD-10-CM | POA: Diagnosis not present

## 2023-01-23 DIAGNOSIS — H348321 Tributary (branch) retinal vein occlusion, left eye, with retinal neovascularization: Secondary | ICD-10-CM | POA: Diagnosis not present

## 2023-01-23 DIAGNOSIS — H34832 Tributary (branch) retinal vein occlusion, left eye, with macular edema: Secondary | ICD-10-CM | POA: Diagnosis not present

## 2023-01-23 DIAGNOSIS — H353132 Nonexudative age-related macular degeneration, bilateral, intermediate dry stage: Secondary | ICD-10-CM | POA: Diagnosis not present

## 2023-02-06 DIAGNOSIS — H35731 Hemorrhagic detachment of retinal pigment epithelium, right eye: Secondary | ICD-10-CM | POA: Diagnosis not present

## 2023-02-06 DIAGNOSIS — H43813 Vitreous degeneration, bilateral: Secondary | ICD-10-CM | POA: Diagnosis not present

## 2023-02-06 DIAGNOSIS — H348322 Tributary (branch) retinal vein occlusion, left eye, stable: Secondary | ICD-10-CM | POA: Diagnosis not present

## 2023-02-06 DIAGNOSIS — H353221 Exudative age-related macular degeneration, left eye, with active choroidal neovascularization: Secondary | ICD-10-CM | POA: Diagnosis not present

## 2023-02-06 DIAGNOSIS — H348321 Tributary (branch) retinal vein occlusion, left eye, with retinal neovascularization: Secondary | ICD-10-CM | POA: Diagnosis not present

## 2023-02-06 DIAGNOSIS — H348312 Tributary (branch) retinal vein occlusion, right eye, stable: Secondary | ICD-10-CM | POA: Diagnosis not present

## 2023-02-06 DIAGNOSIS — H353211 Exudative age-related macular degeneration, right eye, with active choroidal neovascularization: Secondary | ICD-10-CM | POA: Diagnosis not present

## 2023-02-06 DIAGNOSIS — H35722 Serous detachment of retinal pigment epithelium, left eye: Secondary | ICD-10-CM | POA: Diagnosis not present

## 2023-02-06 DIAGNOSIS — H353132 Nonexudative age-related macular degeneration, bilateral, intermediate dry stage: Secondary | ICD-10-CM | POA: Diagnosis not present

## 2023-02-06 DIAGNOSIS — H2513 Age-related nuclear cataract, bilateral: Secondary | ICD-10-CM | POA: Diagnosis not present

## 2023-02-06 DIAGNOSIS — H34832 Tributary (branch) retinal vein occlusion, left eye, with macular edema: Secondary | ICD-10-CM | POA: Diagnosis not present
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
## 2023-02-13 ENCOUNTER — Ambulatory Visit
Admission: RE | Admit: 2023-02-13 | Discharge: 2023-02-13 | Disposition: A | Discharge status: Home / Self Care | Payer: Medicare Other | Attending: Orthopedic Surgery | Admitting: Orthopedic Surgery

## 2023-02-13 ENCOUNTER — Ambulatory Visit (INDEPENDENT_AMBULATORY_CARE_PROVIDER_SITE_OTHER): Payer: Medicare Other | Admitting: Orthopedic Surgery

## 2023-02-13 ENCOUNTER — Ambulatory Visit
Admission: RE | Admit: 2023-02-13 | Discharge: 2023-02-13 | Disposition: A | Discharge status: Home / Self Care | Payer: No Typology Code available for payment source | Source: Ambulatory Visit | Attending: Orthopedic Surgery | Admitting: Orthopedic Surgery

## 2023-02-13 ENCOUNTER — Encounter: Payer: Self-pay | Admitting: Orthopedic Surgery

## 2023-02-13 VITALS — BP 124/78 | Ht 63.0 in | Wt 152.0 lb

## 2023-02-13 DIAGNOSIS — S32010A Wedge compression fracture of first lumbar vertebra, initial encounter for closed fracture: Secondary | ICD-10-CM

## 2023-02-13 DIAGNOSIS — M47816 Spondylosis without myelopathy or radiculopathy, lumbar region: Secondary | ICD-10-CM | POA: Diagnosis not present

## 2023-02-13 DIAGNOSIS — S32010D Wedge compression fracture of first lumbar vertebra, subsequent encounter for fracture with routine healing: Secondary | ICD-10-CM | POA: Diagnosis not present

## 2023-02-13 DIAGNOSIS — M545 Low back pain, unspecified: Secondary | ICD-10-CM | POA: Diagnosis not present

## 2023-02-13 DIAGNOSIS — M4856XA Collapsed vertebra, not elsewhere classified, lumbar region, initial encounter for fracture: Secondary | ICD-10-CM | POA: Diagnosis not present

## 2023-02-13 DIAGNOSIS — M40209 Unspecified kyphosis, site unspecified: Secondary | ICD-10-CM | POA: Diagnosis not present

## 2023-03-12 DIAGNOSIS — H353132 Nonexudative age-related macular degeneration, bilateral, intermediate dry stage: Secondary | ICD-10-CM | POA: Diagnosis not present

## 2023-03-12 DIAGNOSIS — H35731 Hemorrhagic detachment of retinal pigment epithelium, right eye: Secondary | ICD-10-CM | POA: Diagnosis not present

## 2023-03-12 DIAGNOSIS — H353221 Exudative age-related macular degeneration, left eye, with active choroidal neovascularization: Secondary | ICD-10-CM | POA: Diagnosis not present

## 2023-03-12 DIAGNOSIS — H353211 Exudative age-related macular degeneration, right eye, with active choroidal neovascularization: Secondary | ICD-10-CM | POA: Diagnosis not present

## 2023-03-12 DIAGNOSIS — H2513 Age-related nuclear cataract, bilateral: Secondary | ICD-10-CM | POA: Diagnosis not present

## 2023-03-12 DIAGNOSIS — H348312 Tributary (branch) retinal vein occlusion, right eye, stable: Secondary | ICD-10-CM | POA: Diagnosis not present

## 2023-03-12 DIAGNOSIS — H35722 Serous detachment of retinal pigment epithelium, left eye: Secondary | ICD-10-CM | POA: Diagnosis not present

## 2023-03-12 DIAGNOSIS — H43813 Vitreous degeneration, bilateral: Secondary | ICD-10-CM | POA: Diagnosis not present

## 2023-03-12 DIAGNOSIS — H34832 Tributary (branch) retinal vein occlusion, left eye, with macular edema: Secondary | ICD-10-CM | POA: Diagnosis not present

## 2023-03-12 DIAGNOSIS — H348331 Tributary (branch) retinal vein occlusion, bilateral, with retinal neovascularization: Secondary | ICD-10-CM | POA: Diagnosis not present

## 2023-03-15 DIAGNOSIS — Z23 Encounter for immunization: Secondary | ICD-10-CM | POA: Diagnosis not present

## 2023-03-20 DIAGNOSIS — H2513 Age-related nuclear cataract, bilateral: Secondary | ICD-10-CM | POA: Diagnosis not present

## 2023-03-20 DIAGNOSIS — H348312 Tributary (branch) retinal vein occlusion, right eye, stable: Secondary | ICD-10-CM | POA: Diagnosis not present

## 2023-03-20 DIAGNOSIS — H43813 Vitreous degeneration, bilateral: Secondary | ICD-10-CM | POA: Diagnosis not present

## 2023-03-20 DIAGNOSIS — H353132 Nonexudative age-related macular degeneration, bilateral, intermediate dry stage: Secondary | ICD-10-CM | POA: Diagnosis not present

## 2023-03-20 DIAGNOSIS — H348331 Tributary (branch) retinal vein occlusion, bilateral, with retinal neovascularization: Secondary | ICD-10-CM | POA: Diagnosis not present

## 2023-03-20 DIAGNOSIS — H35722 Serous detachment of retinal pigment epithelium, left eye: Secondary | ICD-10-CM | POA: Diagnosis not present

## 2023-03-20 DIAGNOSIS — H353211 Exudative age-related macular degeneration, right eye, with active choroidal neovascularization: Secondary | ICD-10-CM | POA: Diagnosis not present

## 2023-03-20 DIAGNOSIS — H35731 Hemorrhagic detachment of retinal pigment epithelium, right eye: Secondary | ICD-10-CM | POA: Diagnosis not present

## 2023-03-20 DIAGNOSIS — H34832 Tributary (branch) retinal vein occlusion, left eye, with macular edema: Secondary | ICD-10-CM | POA: Diagnosis not present

## 2023-03-20 DIAGNOSIS — H353221 Exudative age-related macular degeneration, left eye, with active choroidal neovascularization: Secondary | ICD-10-CM | POA: Diagnosis not present

## 2023-04-24 DIAGNOSIS — H348312 Tributary (branch) retinal vein occlusion, right eye, stable: Secondary | ICD-10-CM | POA: Diagnosis not present

## 2023-04-24 DIAGNOSIS — H2513 Age-related nuclear cataract, bilateral: Secondary | ICD-10-CM | POA: Diagnosis not present

## 2023-04-24 DIAGNOSIS — H348322 Tributary (branch) retinal vein occlusion, left eye, stable: Secondary | ICD-10-CM | POA: Diagnosis not present

## 2023-04-24 DIAGNOSIS — H35722 Serous detachment of retinal pigment epithelium, left eye: Secondary | ICD-10-CM | POA: Diagnosis not present

## 2023-04-24 DIAGNOSIS — H353211 Exudative age-related macular degeneration, right eye, with active choroidal neovascularization: Secondary | ICD-10-CM | POA: Diagnosis not present

## 2023-04-24 DIAGNOSIS — H353132 Nonexudative age-related macular degeneration, bilateral, intermediate dry stage: Secondary | ICD-10-CM | POA: Diagnosis not present

## 2023-04-24 DIAGNOSIS — H348321 Tributary (branch) retinal vein occlusion, left eye, with retinal neovascularization: Secondary | ICD-10-CM | POA: Diagnosis not present

## 2023-04-24 DIAGNOSIS — H353221 Exudative age-related macular degeneration, left eye, with active choroidal neovascularization: Secondary | ICD-10-CM | POA: Diagnosis not present

## 2023-04-24 DIAGNOSIS — H35731 Hemorrhagic detachment of retinal pigment epithelium, right eye: Secondary | ICD-10-CM | POA: Diagnosis not present

## 2023-04-24 DIAGNOSIS — H43813 Vitreous degeneration, bilateral: Secondary | ICD-10-CM | POA: Diagnosis not present

## 2023-04-24 DIAGNOSIS — H34832 Tributary (branch) retinal vein occlusion, left eye, with macular edema: Secondary | ICD-10-CM | POA: Diagnosis not present
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
## 2023-05-12 DIAGNOSIS — H353221 Exudative age-related macular degeneration, left eye, with active choroidal neovascularization: Secondary | ICD-10-CM | POA: Diagnosis not present

## 2023-05-12 DIAGNOSIS — H353211 Exudative age-related macular degeneration, right eye, with active choroidal neovascularization: Secondary | ICD-10-CM | POA: Diagnosis not present

## 2023-05-12 DIAGNOSIS — H348322 Tributary (branch) retinal vein occlusion, left eye, stable: Secondary | ICD-10-CM | POA: Diagnosis not present

## 2023-05-12 DIAGNOSIS — H348321 Tributary (branch) retinal vein occlusion, left eye, with retinal neovascularization: Secondary | ICD-10-CM | POA: Diagnosis not present

## 2023-05-12 DIAGNOSIS — H25812 Combined forms of age-related cataract, left eye: Secondary | ICD-10-CM | POA: Diagnosis not present

## 2023-05-12 DIAGNOSIS — H35731 Hemorrhagic detachment of retinal pigment epithelium, right eye: Secondary | ICD-10-CM | POA: Diagnosis not present

## 2023-05-12 DIAGNOSIS — H353132 Nonexudative age-related macular degeneration, bilateral, intermediate dry stage: Secondary | ICD-10-CM | POA: Diagnosis not present

## 2023-05-12 DIAGNOSIS — H34832 Tributary (branch) retinal vein occlusion, left eye, with macular edema: Secondary | ICD-10-CM | POA: Diagnosis not present

## 2023-05-12 DIAGNOSIS — H35722 Serous detachment of retinal pigment epithelium, left eye: Secondary | ICD-10-CM | POA: Diagnosis not present

## 2023-05-12 DIAGNOSIS — H43813 Vitreous degeneration, bilateral: Secondary | ICD-10-CM | POA: Diagnosis not present

## 2023-05-12 DIAGNOSIS — H348312 Tributary (branch) retinal vein occlusion, right eye, stable: Secondary | ICD-10-CM | POA: Diagnosis not present

## 2023-05-12 DIAGNOSIS — H2513 Age-related nuclear cataract, bilateral: Secondary | ICD-10-CM | POA: Diagnosis not present

## 2023-05-14 ENCOUNTER — Ambulatory Visit
Admission: RE | Admit: 2023-05-14 | Discharge: 2023-05-14 | Disposition: A | Discharge status: Home / Self Care | Payer: Medicare Other | Source: Ambulatory Visit | Attending: Orthopedic Surgery | Admitting: Orthopedic Surgery

## 2023-05-14 ENCOUNTER — Encounter: Payer: Self-pay | Admitting: Orthopedic Surgery

## 2023-05-14 ENCOUNTER — Ambulatory Visit: Payer: Medicare Other | Admitting: Orthopedic Surgery

## 2023-05-14 ENCOUNTER — Ambulatory Visit
Admission: RE | Admit: 2023-05-14 | Discharge: 2023-05-14 | Disposition: A | Discharge status: Home / Self Care | Payer: Medicare Other | Attending: Orthopedic Surgery | Admitting: Orthopedic Surgery

## 2023-05-14 VITALS — BP 130/82 | Ht 63.0 in | Wt 152.0 lb

## 2023-05-14 DIAGNOSIS — M48061 Spinal stenosis, lumbar region without neurogenic claudication: Secondary | ICD-10-CM | POA: Diagnosis not present

## 2023-05-14 DIAGNOSIS — S32010A Wedge compression fracture of first lumbar vertebra, initial encounter for closed fracture: Secondary | ICD-10-CM | POA: Diagnosis not present

## 2023-05-14 DIAGNOSIS — M51369 Other intervertebral disc degeneration, lumbar region without mention of lumbar back pain or lower extremity pain: Secondary | ICD-10-CM | POA: Diagnosis not present

## 2023-05-14 DIAGNOSIS — I700 Atherosclerosis of aorta: Secondary | ICD-10-CM | POA: Diagnosis not present

## 2023-05-14 DIAGNOSIS — S32010D Wedge compression fracture of first lumbar vertebra, subsequent encounter for fracture with routine healing: Secondary | ICD-10-CM

## 2023-05-14 DIAGNOSIS — M4856XA Collapsed vertebra, not elsewhere classified, lumbar region, initial encounter for fracture: Secondary | ICD-10-CM | POA: Diagnosis not present

## 2023-05-15 DIAGNOSIS — E559 Vitamin D deficiency, unspecified: Secondary | ICD-10-CM | POA: Diagnosis not present

## 2023-05-15 DIAGNOSIS — E782 Mixed hyperlipidemia: Secondary | ICD-10-CM | POA: Diagnosis not present

## 2023-05-15 DIAGNOSIS — E039 Hypothyroidism, unspecified: Secondary | ICD-10-CM | POA: Diagnosis not present

## 2023-05-15 DIAGNOSIS — R7301 Impaired fasting glucose: Secondary | ICD-10-CM | POA: Diagnosis not present

## 2023-05-16 NOTE — Addendum Note (Signed)
Addended byDrake Leach on: 05/16/2023 12:59 PM   Modules accepted: Orders

## 2023-05-20 DIAGNOSIS — J302 Other seasonal allergic rhinitis: Secondary | ICD-10-CM | POA: Diagnosis not present

## 2023-05-20 DIAGNOSIS — R7301 Impaired fasting glucose: Secondary | ICD-10-CM | POA: Diagnosis not present

## 2023-05-20 DIAGNOSIS — I700 Atherosclerosis of aorta: Secondary | ICD-10-CM | POA: Diagnosis not present

## 2023-05-20 DIAGNOSIS — S32009D Unspecified fracture of unspecified lumbar vertebra, subsequent encounter for fracture with routine healing: Secondary | ICD-10-CM | POA: Diagnosis not present

## 2023-05-20 DIAGNOSIS — H353132 Nonexudative age-related macular degeneration, bilateral, intermediate dry stage: Secondary | ICD-10-CM | POA: Diagnosis not present

## 2023-05-20 DIAGNOSIS — M5431 Sciatica, right side: Secondary | ICD-10-CM | POA: Diagnosis not present

## 2023-05-20 DIAGNOSIS — E039 Hypothyroidism, unspecified: Secondary | ICD-10-CM | POA: Diagnosis not present

## 2023-05-20 DIAGNOSIS — E559 Vitamin D deficiency, unspecified: Secondary | ICD-10-CM | POA: Diagnosis not present

## 2023-05-20 DIAGNOSIS — E782 Mixed hyperlipidemia: Secondary | ICD-10-CM | POA: Diagnosis not present

## 2023-05-20 DIAGNOSIS — H7292 Unspecified perforation of tympanic membrane, left ear: Secondary | ICD-10-CM | POA: Diagnosis not present

## 2023-05-20 DIAGNOSIS — I10 Essential (primary) hypertension: Secondary | ICD-10-CM | POA: Diagnosis not present

## 2023-06-05 DIAGNOSIS — H348312 Tributary (branch) retinal vein occlusion, right eye, stable: Secondary | ICD-10-CM | POA: Diagnosis not present

## 2023-06-05 DIAGNOSIS — H348321 Tributary (branch) retinal vein occlusion, left eye, with retinal neovascularization: Secondary | ICD-10-CM | POA: Diagnosis not present

## 2023-06-05 DIAGNOSIS — H348322 Tributary (branch) retinal vein occlusion, left eye, stable: Secondary | ICD-10-CM | POA: Diagnosis not present

## 2023-06-05 DIAGNOSIS — H34832 Tributary (branch) retinal vein occlusion, left eye, with macular edema: Secondary | ICD-10-CM | POA: Diagnosis not present

## 2023-06-05 DIAGNOSIS — H25812 Combined forms of age-related cataract, left eye: Secondary | ICD-10-CM | POA: Diagnosis not present

## 2023-06-05 DIAGNOSIS — H43813 Vitreous degeneration, bilateral: Secondary | ICD-10-CM | POA: Diagnosis not present

## 2023-06-05 DIAGNOSIS — H35731 Hemorrhagic detachment of retinal pigment epithelium, right eye: Secondary | ICD-10-CM | POA: Diagnosis not present

## 2023-06-05 DIAGNOSIS — H353221 Exudative age-related macular degeneration, left eye, with active choroidal neovascularization: Secondary | ICD-10-CM | POA: Diagnosis not present

## 2023-06-05 DIAGNOSIS — H353211 Exudative age-related macular degeneration, right eye, with active choroidal neovascularization: Secondary | ICD-10-CM | POA: Diagnosis not present

## 2023-06-05 DIAGNOSIS — H35722 Serous detachment of retinal pigment epithelium, left eye: Secondary | ICD-10-CM | POA: Diagnosis not present

## 2023-06-05 DIAGNOSIS — H353132 Nonexudative age-related macular degeneration, bilateral, intermediate dry stage: Secondary | ICD-10-CM | POA: Diagnosis not present

## 2023-06-05 DIAGNOSIS — H2513 Age-related nuclear cataract, bilateral: Secondary | ICD-10-CM | POA: Diagnosis not present

## 2023-06-09 DIAGNOSIS — H353221 Exudative age-related macular degeneration, left eye, with active choroidal neovascularization: Secondary | ICD-10-CM | POA: Diagnosis not present

## 2023-06-09 DIAGNOSIS — H43813 Vitreous degeneration, bilateral: Secondary | ICD-10-CM | POA: Diagnosis not present

## 2023-06-09 DIAGNOSIS — H25812 Combined forms of age-related cataract, left eye: Secondary | ICD-10-CM | POA: Diagnosis not present

## 2023-06-09 DIAGNOSIS — H353132 Nonexudative age-related macular degeneration, bilateral, intermediate dry stage: Secondary | ICD-10-CM | POA: Diagnosis not present

## 2023-06-09 DIAGNOSIS — H353211 Exudative age-related macular degeneration, right eye, with active choroidal neovascularization: Secondary | ICD-10-CM | POA: Diagnosis not present

## 2023-06-09 DIAGNOSIS — H348322 Tributary (branch) retinal vein occlusion, left eye, stable: Secondary | ICD-10-CM | POA: Diagnosis not present

## 2023-06-09 DIAGNOSIS — H34832 Tributary (branch) retinal vein occlusion, left eye, with macular edema: Secondary | ICD-10-CM | POA: Diagnosis not present

## 2023-06-09 DIAGNOSIS — H35722 Serous detachment of retinal pigment epithelium, left eye: Secondary | ICD-10-CM | POA: Diagnosis not present

## 2023-06-09 DIAGNOSIS — H348312 Tributary (branch) retinal vein occlusion, right eye, stable: Secondary | ICD-10-CM | POA: Diagnosis not present

## 2023-06-09 DIAGNOSIS — H348321 Tributary (branch) retinal vein occlusion, left eye, with retinal neovascularization: Secondary | ICD-10-CM | POA: Diagnosis not present

## 2023-06-09 DIAGNOSIS — H2513 Age-related nuclear cataract, bilateral: Secondary | ICD-10-CM | POA: Diagnosis not present

## 2023-06-24 DIAGNOSIS — H35722 Serous detachment of retinal pigment epithelium, left eye: Secondary | ICD-10-CM | POA: Diagnosis not present

## 2023-06-24 DIAGNOSIS — H2513 Age-related nuclear cataract, bilateral: Secondary | ICD-10-CM | POA: Diagnosis not present

## 2023-06-24 DIAGNOSIS — H353131 Nonexudative age-related macular degeneration, bilateral, early dry stage: Secondary | ICD-10-CM | POA: Diagnosis not present

## 2023-06-24 DIAGNOSIS — H353211 Exudative age-related macular degeneration, right eye, with active choroidal neovascularization: Secondary | ICD-10-CM | POA: Diagnosis not present

## 2023-06-30 ENCOUNTER — Encounter (HOSPITAL_COMMUNITY): Payer: Medicare Other

## 2023-07-03 ENCOUNTER — Ambulatory Visit (HOSPITAL_COMMUNITY): Payer: Medicare Other | Attending: Orthopedic Surgery

## 2023-07-03 ENCOUNTER — Other Ambulatory Visit: Payer: Self-pay

## 2023-07-03 DIAGNOSIS — M5459 Other low back pain: Secondary | ICD-10-CM | POA: Diagnosis not present

## 2023-07-03 DIAGNOSIS — S32010A Wedge compression fracture of first lumbar vertebra, initial encounter for closed fracture: Secondary | ICD-10-CM | POA: Insufficient documentation

## 2023-07-03 NOTE — Patient Instructions (Signed)
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
## 2023-07-08 DIAGNOSIS — H34832 Tributary (branch) retinal vein occlusion, left eye, with macular edema: Secondary | ICD-10-CM | POA: Diagnosis not present

## 2023-07-08 DIAGNOSIS — H353211 Exudative age-related macular degeneration, right eye, with active choroidal neovascularization: Secondary | ICD-10-CM | POA: Diagnosis not present

## 2023-07-08 DIAGNOSIS — H353132 Nonexudative age-related macular degeneration, bilateral, intermediate dry stage: Secondary | ICD-10-CM | POA: Diagnosis not present

## 2023-07-08 DIAGNOSIS — H25812 Combined forms of age-related cataract, left eye: Secondary | ICD-10-CM | POA: Diagnosis not present

## 2023-07-08 DIAGNOSIS — H2513 Age-related nuclear cataract, bilateral: Secondary | ICD-10-CM | POA: Diagnosis not present

## 2023-07-08 DIAGNOSIS — H353221 Exudative age-related macular degeneration, left eye, with active choroidal neovascularization: Secondary | ICD-10-CM | POA: Diagnosis not present

## 2023-07-08 DIAGNOSIS — H43813 Vitreous degeneration, bilateral: Secondary | ICD-10-CM | POA: Diagnosis not present

## 2023-07-08 DIAGNOSIS — H348322 Tributary (branch) retinal vein occlusion, left eye, stable: Secondary | ICD-10-CM | POA: Diagnosis not present

## 2023-07-08 DIAGNOSIS — H35722 Serous detachment of retinal pigment epithelium, left eye: Secondary | ICD-10-CM | POA: Diagnosis not present

## 2023-07-08 DIAGNOSIS — H348321 Tributary (branch) retinal vein occlusion, left eye, with retinal neovascularization: Secondary | ICD-10-CM | POA: Diagnosis not present

## 2023-07-08 DIAGNOSIS — H348312 Tributary (branch) retinal vein occlusion, right eye, stable: Secondary | ICD-10-CM | POA: Diagnosis not present

## 2023-07-08 DIAGNOSIS — H35731 Hemorrhagic detachment of retinal pigment epithelium, right eye: Secondary | ICD-10-CM | POA: Diagnosis not present

## 2023-07-10 ENCOUNTER — Encounter (HOSPITAL_COMMUNITY): Payer: Medicare Other

## 2023-07-11 NOTE — Progress Notes (Deleted)
   Telephone Visit- Progress Note: Referring Physician:  Benita Stabile, MD [address] [place],  [place] [address]  Primary Physician:  Benita Stabile, MD  This visit was performed via telephone.  Patient location: home Provider location: office  I spent a total of *** minutes non-face-to-face activities for this visit on the date of this encounter including review of current clinical condition and response to treatment.    Patient has given verbal consent to this telephone visits and we reviewed the limitations of a telephone visit. Patient wishes to proceed.    Chief Complaint:  ***  History of Present Illness: Carrie Osborne is a 83 y.o. female has a history of ***.     Exam: No exam done as this was a telephone encounter.     Imaging: ***  I have personally reviewed the images and agree with the above interpretation.  Assessment and Plan: [name] is a pleasant 83 y.o. female with ***  Treatment options discussed with patient and following plan made:   - Order for physical therapy for *** spine ***. Patient to call to schedule appointment. *** - Continue current medications including ***. Reviewed dosing and side effects.  - Prescription for ***. Reviewed dosing and side effects. Take with food.  - Prescription for *** to take prn muscle spasms. Reviewed dosing and side effects. Discussed this can cause drowsiness.  - MRI of *** to further evaluate *** radiculopathy. No improvement time or medications (***).  - Referral to PMR at [facility] to discuss possible *** injections.  - Will schedule phone visit to review MRI results once I get them back.   Drake Leach PA-C Neurosurgery

## 2023-07-15 ENCOUNTER — Ambulatory Visit (HOSPITAL_COMMUNITY): Payer: Medicare Other

## 2023-07-15 DIAGNOSIS — M5459 Other low back pain: Secondary | ICD-10-CM
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
## 2023-07-15 NOTE — Therapy (Signed)
[facility] [address] [place], [place], [address] Phone: [phone]   Fax:  [phone]  Patient Details  Name: Carrie Osborne MRN: [id number] Date of Birth: 03-21-1941 Referring Provider:  Benita Stabile, MD  Encounter Date: 07/15/2023  Patient arrives for appt.  She states she would like to hold her PT for now as she is facing upcoming cataract surgery and does not want to chance anything affecting her ability to have surgery.  Also she will not be able to drive for 2 weeks after her surgery.  We set a re-evaluation appointment up for 08/11/23 and we will resume her care at that time.   2:17 PM, 07/15/23 [name] [name] MPT [place] [facility] [place] [id number] Ph:[phone]  [name], PT 07/15/2023, 2:16 PM  [name] [address] [place], [place], [address] Phone: [phone]   Fax:  [phone]

## 2023-07-17 ENCOUNTER — Encounter (HOSPITAL_COMMUNITY): Payer: Medicare Other

## 2023-07-18 ENCOUNTER — Telehealth: Payer: Medicare Other | Admitting: Orthopedic Surgery

## 2023-07-21 DIAGNOSIS — H2513 Age-related nuclear cataract, bilateral: Secondary | ICD-10-CM | POA: Diagnosis not present

## 2023-07-21 DIAGNOSIS — H353221 Exudative age-related macular degeneration, left eye, with active choroidal neovascularization: Secondary | ICD-10-CM | POA: Diagnosis not present

## 2023-07-21 DIAGNOSIS — H348321 Tributary (branch) retinal vein occlusion, left eye, with retinal neovascularization: Secondary | ICD-10-CM | POA: Diagnosis not present

## 2023-07-21 DIAGNOSIS — H348322 Tributary (branch) retinal vein occlusion, left eye, stable: Secondary | ICD-10-CM | POA: Diagnosis not present

## 2023-07-21 DIAGNOSIS — H35731 Hemorrhagic detachment of retinal pigment epithelium, right eye: Secondary | ICD-10-CM | POA: Diagnosis not present

## 2023-07-21 DIAGNOSIS — H348312 Tributary (branch) retinal vein occlusion, right eye, stable: Secondary | ICD-10-CM | POA: Diagnosis not present

## 2023-07-21 DIAGNOSIS — H353132 Nonexudative age-related macular degeneration, bilateral, intermediate dry stage: Secondary | ICD-10-CM | POA: Diagnosis not present

## 2023-07-21 DIAGNOSIS — H25812 Combined forms of age-related cataract, left eye: Secondary | ICD-10-CM | POA: Diagnosis not present

## 2023-07-21 DIAGNOSIS — H209 Unspecified iridocyclitis: Secondary | ICD-10-CM | POA: Diagnosis not present

## 2023-07-21 DIAGNOSIS — H34832 Tributary (branch) retinal vein occlusion, left eye, with macular edema: Secondary | ICD-10-CM | POA: Diagnosis not present

## 2023-07-21 DIAGNOSIS — H353131 Nonexudative age-related macular degeneration, bilateral, early dry stage: Secondary | ICD-10-CM | POA: Diagnosis not present

## 2023-07-21 DIAGNOSIS — H35722 Serous detachment of retinal pigment epithelium, left eye: Secondary | ICD-10-CM | POA: Diagnosis not present

## 2023-07-21 DIAGNOSIS — H353211 Exudative age-related macular degeneration, right eye, with active choroidal neovascularization: Secondary | ICD-10-CM | POA: Diagnosis not present

## 2023-07-21 DIAGNOSIS — H43813 Vitreous degeneration, bilateral: Secondary | ICD-10-CM | POA: Diagnosis not present

## 2023-07-22 ENCOUNTER — Encounter (HOSPITAL_COMMUNITY): Payer: Medicare Other

## 2023-07-23 DIAGNOSIS — H44002 Unspecified purulent endophthalmitis, left eye: Secondary | ICD-10-CM | POA: Diagnosis not present

## 2023-07-23 DIAGNOSIS — H4419 Other endophthalmitis: Secondary | ICD-10-CM | POA: Diagnosis not present

## 2023-07-28 ENCOUNTER — Encounter (HOSPITAL_COMMUNITY): Payer: Medicare Other

## 2023-07-29 DIAGNOSIS — H353211 Exudative age-related macular degeneration, right eye, with active choroidal neovascularization: Secondary | ICD-10-CM | POA: Diagnosis not present

## 2023-07-29 DIAGNOSIS — H348322 Tributary (branch) retinal vein occlusion, left eye, stable: Secondary | ICD-10-CM | POA: Diagnosis not present

## 2023-07-29 DIAGNOSIS — H35731 Hemorrhagic detachment of retinal pigment epithelium, right eye: Secondary | ICD-10-CM | POA: Diagnosis not present

## 2023-07-29 DIAGNOSIS — H353132 Nonexudative age-related macular degeneration, bilateral, intermediate dry stage: Secondary | ICD-10-CM | POA: Diagnosis not present

## 2023-07-29 DIAGNOSIS — H2513 Age-related nuclear cataract, bilateral: Secondary | ICD-10-CM | POA: Diagnosis not present

## 2023-07-29 DIAGNOSIS — H353221 Exudative age-related macular degeneration, left eye, with active choroidal neovascularization: Secondary | ICD-10-CM | POA: Diagnosis not present

## 2023-07-29 DIAGNOSIS — H35722 Serous detachment of retinal pigment epithelium, left eye: Secondary | ICD-10-CM | POA: Diagnosis not present

## 2023-07-29 DIAGNOSIS — H25812 Combined forms of age-related cataract, left eye: Secondary | ICD-10-CM | POA: Diagnosis not present

## 2023-07-29 DIAGNOSIS — H43813 Vitreous degeneration, bilateral: Secondary | ICD-10-CM | POA: Diagnosis not present

## 2023-07-29 DIAGNOSIS — H348321 Tributary (branch) retinal vein occlusion, left eye, with retinal neovascularization: Secondary | ICD-10-CM | POA: Diagnosis not present

## 2023-07-29 DIAGNOSIS — H34832 Tributary (branch) retinal vein occlusion, left eye, with macular edema: Secondary | ICD-10-CM | POA: Diagnosis not present

## 2023-07-29 DIAGNOSIS — H348312 Tributary (branch) retinal vein occlusion, right eye, stable: Secondary | ICD-10-CM | POA: Diagnosis not present

## 2023-07-30 ENCOUNTER — Encounter (HOSPITAL_COMMUNITY): Payer: Medicare Other

## 2023-08-11 ENCOUNTER — Encounter (HOSPITAL_COMMUNITY): Payer: Medicare Other
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
## 2023-08-15 ENCOUNTER — Ambulatory Visit (INDEPENDENT_AMBULATORY_CARE_PROVIDER_SITE_OTHER): Payer: Medicare Other | Admitting: Orthopedic Surgery

## 2023-08-15 ENCOUNTER — Encounter: Payer: Self-pay | Admitting: Orthopedic Surgery

## 2023-08-15 DIAGNOSIS — S32010D Wedge compression fracture of first lumbar vertebra, subsequent encounter for fracture with routine healing: Secondary | ICD-10-CM

## 2023-08-15 DIAGNOSIS — S32010A Wedge compression fracture of first lumbar vertebra, initial encounter for closed fracture: Secondary | ICD-10-CM

## 2023-08-26 DIAGNOSIS — H25812 Combined forms of age-related cataract, left eye: Secondary | ICD-10-CM | POA: Diagnosis not present

## 2023-09-08 ENCOUNTER — Encounter (HOSPITAL_COMMUNITY): Payer: Medicare Other

## 2023-09-09 DIAGNOSIS — H348321 Tributary (branch) retinal vein occlusion, left eye, with retinal neovascularization: Secondary | ICD-10-CM | POA: Diagnosis not present

## 2023-09-09 DIAGNOSIS — H353221 Exudative age-related macular degeneration, left eye, with active choroidal neovascularization: Secondary | ICD-10-CM | POA: Diagnosis not present

## 2023-09-09 DIAGNOSIS — H35731 Hemorrhagic detachment of retinal pigment epithelium, right eye: Secondary | ICD-10-CM | POA: Diagnosis not present

## 2023-09-09 DIAGNOSIS — H34832 Tributary (branch) retinal vein occlusion, left eye, with macular edema: Secondary | ICD-10-CM | POA: Diagnosis not present

## 2023-09-09 DIAGNOSIS — H348312 Tributary (branch) retinal vein occlusion, right eye, stable: Secondary | ICD-10-CM | POA: Diagnosis not present

## 2023-09-09 DIAGNOSIS — H2511 Age-related nuclear cataract, right eye: Secondary | ICD-10-CM | POA: Diagnosis not present

## 2023-09-09 DIAGNOSIS — H43813 Vitreous degeneration, bilateral: Secondary | ICD-10-CM | POA: Diagnosis not present

## 2023-09-09 DIAGNOSIS — H353132 Nonexudative age-related macular degeneration, bilateral, intermediate dry stage: Secondary | ICD-10-CM | POA: Diagnosis not present

## 2023-09-09 DIAGNOSIS — H35722 Serous detachment of retinal pigment epithelium, left eye: Secondary | ICD-10-CM | POA: Diagnosis not present

## 2023-09-09 DIAGNOSIS — H348322 Tributary (branch) retinal vein occlusion, left eye, stable: Secondary | ICD-10-CM | POA: Diagnosis not present

## 2023-09-09 DIAGNOSIS — H353211 Exudative age-related macular degeneration, right eye, with active choroidal neovascularization: Secondary | ICD-10-CM | POA: Diagnosis not present

## 2023-09-11 DIAGNOSIS — E782 Mixed hyperlipidemia: Secondary | ICD-10-CM | POA: Diagnosis not present

## 2023-09-11 DIAGNOSIS — E039 Hypothyroidism, unspecified: Secondary | ICD-10-CM | POA: Diagnosis not present

## 2023-09-11 DIAGNOSIS — R7301 Impaired fasting glucose: Secondary | ICD-10-CM | POA: Diagnosis not present

## 2023-09-11 DIAGNOSIS — E559 Vitamin D deficiency, unspecified: Secondary | ICD-10-CM | POA: Diagnosis not present

## 2023-09-16 DIAGNOSIS — H353132 Nonexudative age-related macular degeneration, bilateral, intermediate dry stage: Secondary | ICD-10-CM | POA: Diagnosis not present

## 2023-09-16 DIAGNOSIS — H35731 Hemorrhagic detachment of retinal pigment epithelium, right eye: Secondary | ICD-10-CM | POA: Diagnosis not present

## 2023-09-16 DIAGNOSIS — H35722 Serous detachment of retinal pigment epithelium, left eye: Secondary | ICD-10-CM | POA: Diagnosis not present

## 2023-09-16 DIAGNOSIS — H35372 Puckering of macula, left eye: Secondary | ICD-10-CM | POA: Diagnosis not present

## 2023-09-16 DIAGNOSIS — H353221 Exudative age-related macular degeneration, left eye, with active choroidal neovascularization: Secondary | ICD-10-CM | POA: Diagnosis not present

## 2023-09-16 DIAGNOSIS — H34832 Tributary (branch) retinal vein occlusion, left eye, with macular edema: Secondary | ICD-10-CM | POA: Diagnosis not present

## 2023-09-16 DIAGNOSIS — H353211 Exudative age-related macular degeneration, right eye, with active choroidal neovascularization: Secondary | ICD-10-CM | POA: Diagnosis not present

## 2023-09-16 DIAGNOSIS — H348312 Tributary (branch) retinal vein occlusion, right eye, stable: Secondary | ICD-10-CM | POA: Diagnosis not present

## 2023-09-18 DIAGNOSIS — S32009D Unspecified fracture of unspecified lumbar vertebra, subsequent encounter for fracture with routine healing: Secondary | ICD-10-CM | POA: Diagnosis not present

## 2023-09-18 DIAGNOSIS — H269 Unspecified cataract: Secondary | ICD-10-CM | POA: Diagnosis not present

## 2023-09-18 DIAGNOSIS — R7301 Impaired fasting glucose: Secondary | ICD-10-CM | POA: Diagnosis not present

## 2023-09-18 DIAGNOSIS — I10 Essential (primary) hypertension: Secondary | ICD-10-CM | POA: Diagnosis not present

## 2023-09-18 DIAGNOSIS — I700 Atherosclerosis of aorta: Secondary | ICD-10-CM | POA: Diagnosis not present

## 2023-09-18 DIAGNOSIS — E782 Mixed hyperlipidemia: Secondary | ICD-10-CM | POA: Diagnosis not present

## 2023-09-18 DIAGNOSIS — H353132 Nonexudative age-related macular degeneration, bilateral, intermediate dry stage: Secondary | ICD-10-CM | POA: Diagnosis not present

## 2023-09-18 DIAGNOSIS — E039 Hypothyroidism, unspecified: Secondary | ICD-10-CM | POA: Diagnosis not present

## 2023-09-18 DIAGNOSIS — H7290 Unspecified perforation of tympanic membrane, unspecified ear: Secondary | ICD-10-CM | POA: Diagnosis not present

## 2023-09-18 DIAGNOSIS — M5431 Sciatica, right side: Secondary | ICD-10-CM | POA: Diagnosis not present

## 2023-09-18 DIAGNOSIS — E559 Vitamin D deficiency, unspecified: Secondary | ICD-10-CM | POA: Diagnosis not present

## 2023-10-07 DIAGNOSIS — H35372 Puckering of macula, left eye: Secondary | ICD-10-CM | POA: Diagnosis not present

## 2023-10-07 DIAGNOSIS — H353132 Nonexudative age-related macular degeneration, bilateral, intermediate dry stage: Secondary | ICD-10-CM | POA: Diagnosis not present

## 2023-10-07 DIAGNOSIS — H35731 Hemorrhagic detachment of retinal pigment epithelium, right eye: Secondary | ICD-10-CM | POA: Diagnosis not present

## 2023-10-07 DIAGNOSIS — H34832 Tributary (branch) retinal vein occlusion, left eye, with macular edema: Secondary | ICD-10-CM | POA: Diagnosis not present

## 2023-10-07 DIAGNOSIS — H348312 Tributary (branch) retinal vein occlusion, right eye, stable: Secondary | ICD-10-CM | POA: Diagnosis not present

## 2023-10-07 DIAGNOSIS — H353211 Exudative age-related macular degeneration, right eye, with active choroidal neovascularization: Secondary | ICD-10-CM | POA: Diagnosis not present

## 2023-10-07 DIAGNOSIS — H4042X2 Glaucoma secondary to eye inflammation, left eye, moderate stage: Secondary | ICD-10-CM | POA: Diagnosis not present

## 2023-10-07 DIAGNOSIS — H353221 Exudative age-related macular degeneration, left eye, with active choroidal neovascularization: Secondary | ICD-10-CM | POA: Diagnosis not present

## 2023-10-07 DIAGNOSIS — H348322 Tributary (branch) retinal vein occlusion, left eye, stable: Secondary | ICD-10-CM | POA: Diagnosis not present

## 2023-10-07 DIAGNOSIS — H2511 Age-related nuclear cataract, right eye: Secondary | ICD-10-CM | POA: Diagnosis not present

## 2023-10-09 DIAGNOSIS — H43813 Vitreous degeneration, bilateral: Secondary | ICD-10-CM | POA: Diagnosis not present

## 2023-10-09 DIAGNOSIS — H35731 Hemorrhagic detachment of retinal pigment epithelium, right eye: Secondary | ICD-10-CM | POA: Diagnosis not present

## 2023-10-09 DIAGNOSIS — H7292 Unspecified perforation of tympanic membrane, left ear: Secondary | ICD-10-CM | POA: Diagnosis not present

## 2023-10-09 DIAGNOSIS — H353211 Exudative age-related macular degeneration, right eye, with active choroidal neovascularization: Secondary | ICD-10-CM | POA: Diagnosis not present

## 2023-10-09 DIAGNOSIS — H35722 Serous detachment of retinal pigment epithelium, left eye: Secondary | ICD-10-CM | POA: Diagnosis not present

## 2023-10-09 DIAGNOSIS — H7293 Unspecified perforation of tympanic membrane, bilateral: Secondary | ICD-10-CM | POA: Diagnosis not present

## 2023-10-09 DIAGNOSIS — H348312 Tributary (branch) retinal vein occlusion, right eye, stable: Secondary | ICD-10-CM | POA: Diagnosis not present

## 2023-10-09 DIAGNOSIS — H35372 Puckering of macula, left eye: Secondary | ICD-10-CM | POA: Diagnosis not present

## 2023-10-09 DIAGNOSIS — H4042X2 Glaucoma secondary to eye inflammation, left eye, moderate stage: Secondary | ICD-10-CM | POA: Diagnosis not present

## 2023-10-09 DIAGNOSIS — H348322 Tributary (branch) retinal vein occlusion, left eye, stable: Secondary | ICD-10-CM | POA: Diagnosis not present

## 2023-10-09 DIAGNOSIS — H353132 Nonexudative age-related macular degeneration, bilateral, intermediate dry stage: Secondary | ICD-10-CM | POA: Diagnosis not present

## 2023-10-09 DIAGNOSIS — H353221 Exudative age-related macular degeneration, left eye, with active choroidal neovascularization: Secondary | ICD-10-CM | POA: Diagnosis not present

## 2023-10-09 DIAGNOSIS — H34832 Tributary (branch) retinal vein occlusion, left eye, with macular edema: Secondary | ICD-10-CM | POA: Diagnosis not present

## 2023-10-09 DIAGNOSIS — J302 Other seasonal allergic rhinitis: Secondary | ICD-10-CM | POA: Diagnosis not present

## 2023-10-09 DIAGNOSIS — J069 Acute upper respiratory infection, unspecified: Secondary | ICD-10-CM | POA: Diagnosis not present

## 2023-10-09 DIAGNOSIS — H2511 Age-related nuclear cataract, right eye: Secondary | ICD-10-CM | POA: Diagnosis not present

## 2023-10-16 DIAGNOSIS — H722X2 Other marginal perforations of tympanic membrane, left ear: Secondary | ICD-10-CM | POA: Diagnosis not present

## 2023-10-16 DIAGNOSIS — H6123 Impacted cerumen, bilateral: Secondary | ICD-10-CM | POA: Diagnosis not present

## 2023-10-16 DIAGNOSIS — J018 Other acute sinusitis: Secondary | ICD-10-CM | POA: Diagnosis not present

## 2023-10-21 DIAGNOSIS — H348332 Tributary (branch) retinal vein occlusion, bilateral, stable: Secondary | ICD-10-CM | POA: Diagnosis not present

## 2023-10-21 DIAGNOSIS — H353132 Nonexudative age-related macular degeneration, bilateral, intermediate dry stage: Secondary | ICD-10-CM | POA: Diagnosis not present

## 2023-10-21 DIAGNOSIS — H34832 Tributary (branch) retinal vein occlusion, left eye, with macular edema: Secondary | ICD-10-CM | POA: Diagnosis not present

## 2023-10-21 DIAGNOSIS — H35731 Hemorrhagic detachment of retinal pigment epithelium, right eye: Secondary | ICD-10-CM | POA: Diagnosis not present

## 2023-10-21 DIAGNOSIS — H35372 Puckering of macula, left eye: Secondary | ICD-10-CM | POA: Diagnosis not present

## 2023-10-21 DIAGNOSIS — H353231 Exudative age-related macular degeneration, bilateral, with active choroidal neovascularization: Secondary | ICD-10-CM | POA: Diagnosis not present

## 2023-10-21 DIAGNOSIS — H2511 Age-related nuclear cataract, right eye: Secondary | ICD-10-CM | POA: Diagnosis not present

## 2023-10-21 DIAGNOSIS — H4042X2 Glaucoma secondary to eye inflammation, left eye, moderate stage: Secondary | ICD-10-CM | POA: Diagnosis not present

## 2023-10-21 DIAGNOSIS — H35722 Serous detachment of retinal pigment epithelium, left eye: Secondary | ICD-10-CM | POA: Diagnosis not present

## 2023-10-21 DIAGNOSIS — H43813 Vitreous degeneration, bilateral: Secondary | ICD-10-CM | POA: Diagnosis not present

## 2023-11-11 DIAGNOSIS — J302 Other seasonal allergic rhinitis: Secondary | ICD-10-CM | POA: Diagnosis not present

## 2023-11-25 DIAGNOSIS — H35372 Puckering of macula, left eye: Secondary | ICD-10-CM | POA: Diagnosis not present

## 2023-11-25 DIAGNOSIS — H348322 Tributary (branch) retinal vein occlusion, left eye, stable: Secondary | ICD-10-CM | POA: Diagnosis not present

## 2023-11-25 DIAGNOSIS — H353221 Exudative age-related macular degeneration, left eye, with active choroidal neovascularization: Secondary | ICD-10-CM | POA: Diagnosis not present

## 2023-11-25 DIAGNOSIS — H43813 Vitreous degeneration, bilateral: Secondary | ICD-10-CM | POA: Diagnosis not present

## 2023-11-25 DIAGNOSIS — H35731 Hemorrhagic detachment of retinal pigment epithelium, right eye: Secondary | ICD-10-CM | POA: Diagnosis not present

## 2023-11-25 DIAGNOSIS — H34832 Tributary (branch) retinal vein occlusion, left eye, with macular edema: Secondary | ICD-10-CM | POA: Diagnosis not present

## 2023-11-25 DIAGNOSIS — H353132 Nonexudative age-related macular degeneration, bilateral, intermediate dry stage: Secondary | ICD-10-CM | POA: Diagnosis not present

## 2023-11-25 DIAGNOSIS — H35722 Serous detachment of retinal pigment epithelium, left eye: Secondary | ICD-10-CM | POA: Diagnosis not present

## 2023-11-25 DIAGNOSIS — H4042X2 Glaucoma secondary to eye inflammation, left eye, moderate stage: Secondary | ICD-10-CM | POA: Diagnosis not present

## 2023-11-25 DIAGNOSIS — H348312 Tributary (branch) retinal vein occlusion, right eye, stable: Secondary | ICD-10-CM | POA: Diagnosis not present

## 2023-11-25 DIAGNOSIS — H353211 Exudative age-related macular degeneration, right eye, with active choroidal neovascularization: Secondary | ICD-10-CM | POA: Diagnosis not present

## 2023-11-25 DIAGNOSIS — H2511 Age-related nuclear cataract, right eye: Secondary | ICD-10-CM | POA: Diagnosis not present

## 2024-01-20 DIAGNOSIS — H4042X2 Glaucoma secondary to eye inflammation, left eye, moderate stage: Secondary | ICD-10-CM | POA: Diagnosis not present

## 2024-01-20 DIAGNOSIS — H35722 Serous detachment of retinal pigment epithelium, left eye: Secondary | ICD-10-CM | POA: Diagnosis not present

## 2024-01-20 DIAGNOSIS — H2511 Age-related nuclear cataract, right eye: Secondary | ICD-10-CM | POA: Diagnosis not present

## 2024-01-20 DIAGNOSIS — H348332 Tributary (branch) retinal vein occlusion, bilateral, stable: Secondary | ICD-10-CM | POA: Diagnosis not present

## 2024-01-20 DIAGNOSIS — H34832 Tributary (branch) retinal vein occlusion, left eye, with macular edema: Secondary | ICD-10-CM | POA: Diagnosis not present

## 2024-01-20 DIAGNOSIS — H35373 Puckering of macula, bilateral: Secondary | ICD-10-CM | POA: Diagnosis not present

## 2024-01-20 DIAGNOSIS — H353132 Nonexudative age-related macular degeneration, bilateral, intermediate dry stage: Secondary | ICD-10-CM | POA: Diagnosis not present

## 2024-01-20 DIAGNOSIS — H353211 Exudative age-related macular degeneration, right eye, with active choroidal neovascularization: Secondary | ICD-10-CM | POA: Diagnosis not present

## 2024-01-27 DIAGNOSIS — H25813 Combined forms of age-related cataract, bilateral: Secondary | ICD-10-CM | POA: Diagnosis not present

## 2024-01-27 DIAGNOSIS — H353131 Nonexudative age-related macular degeneration, bilateral, early dry stage: Secondary | ICD-10-CM | POA: Diagnosis not present

## 2024-01-27 DIAGNOSIS — Z961 Presence of intraocular lens: Secondary | ICD-10-CM | POA: Diagnosis not present

## 2024-01-27 DIAGNOSIS — H35722 Serous detachment of retinal pigment epithelium, left eye: Secondary | ICD-10-CM | POA: Diagnosis not present

## 2024-01-27 DIAGNOSIS — H353211 Exudative age-related macular degeneration, right eye, with active choroidal neovascularization: Secondary | ICD-10-CM | POA: Diagnosis not present

## 2024-02-11 DIAGNOSIS — H2511 Age-related nuclear cataract, right eye: Secondary | ICD-10-CM | POA: Diagnosis not present

## 2024-02-13 DIAGNOSIS — H2589 Other age-related cataract: Secondary | ICD-10-CM | POA: Diagnosis not present

## 2024-02-13 DIAGNOSIS — H268 Other specified cataract: Secondary | ICD-10-CM | POA: Diagnosis not present

## 2024-03-02 DIAGNOSIS — H35372 Puckering of macula, left eye: Secondary | ICD-10-CM | POA: Diagnosis not present

## 2024-03-02 DIAGNOSIS — H353211 Exudative age-related macular degeneration, right eye, with active choroidal neovascularization: Secondary | ICD-10-CM | POA: Diagnosis not present

## 2024-03-02 DIAGNOSIS — H353221 Exudative age-related macular degeneration, left eye, with active choroidal neovascularization: Secondary | ICD-10-CM | POA: Diagnosis not present

## 2024-03-02 DIAGNOSIS — H348312 Tributary (branch) retinal vein occlusion, right eye, stable: Secondary | ICD-10-CM | POA: Diagnosis not present

## 2024-03-02 DIAGNOSIS — H4042X2 Glaucoma secondary to eye inflammation, left eye, moderate stage: Secondary | ICD-10-CM | POA: Diagnosis not present

## 2024-03-02 DIAGNOSIS — H34832 Tributary (branch) retinal vein occlusion, left eye, with macular edema: Secondary | ICD-10-CM | POA: Diagnosis not present

## 2024-03-02 DIAGNOSIS — H353132 Nonexudative age-related macular degeneration, bilateral, intermediate dry stage: Secondary | ICD-10-CM | POA: Diagnosis not present

## 2024-03-02 DIAGNOSIS — H35722 Serous detachment of retinal pigment epithelium, left eye: Secondary | ICD-10-CM | POA: Diagnosis not present

## 2024-03-02 DIAGNOSIS — H35731 Hemorrhagic detachment of retinal pigment epithelium, right eye: Secondary | ICD-10-CM | POA: Diagnosis not present

## 2024-03-02 DIAGNOSIS — H43813 Vitreous degeneration, bilateral: Secondary | ICD-10-CM | POA: Diagnosis not present

## 2024-03-02 DIAGNOSIS — H348322 Tributary (branch) retinal vein occlusion, left eye, stable: Secondary | ICD-10-CM | POA: Diagnosis not present

## 2024-03-16 DIAGNOSIS — R7301 Impaired fasting glucose: Secondary | ICD-10-CM | POA: Diagnosis not present

## 2024-03-16 DIAGNOSIS — E039 Hypothyroidism, unspecified: Secondary | ICD-10-CM | POA: Diagnosis not present

## 2024-03-16 DIAGNOSIS — E782 Mixed hyperlipidemia: Secondary | ICD-10-CM | POA: Diagnosis not present

## 2024-03-16 DIAGNOSIS — E559 Vitamin D deficiency, unspecified: Secondary | ICD-10-CM | POA: Diagnosis not present

## 2024-03-22 DIAGNOSIS — H353132 Nonexudative age-related macular degeneration, bilateral, intermediate dry stage: Secondary | ICD-10-CM | POA: Diagnosis not present

## 2024-03-22 DIAGNOSIS — H7292 Unspecified perforation of tympanic membrane, left ear: Secondary | ICD-10-CM | POA: Diagnosis not present

## 2024-03-22 DIAGNOSIS — R7301 Impaired fasting glucose: Secondary | ICD-10-CM | POA: Diagnosis not present

## 2024-03-22 DIAGNOSIS — I700 Atherosclerosis of aorta: Secondary | ICD-10-CM | POA: Diagnosis not present

## 2024-03-22 DIAGNOSIS — E039 Hypothyroidism, unspecified: Secondary | ICD-10-CM | POA: Diagnosis not present

## 2024-03-22 DIAGNOSIS — M5431 Sciatica, right side: Secondary | ICD-10-CM | POA: Diagnosis not present

## 2024-03-22 DIAGNOSIS — E559 Vitamin D deficiency, unspecified: Secondary | ICD-10-CM | POA: Diagnosis not present

## 2024-03-22 DIAGNOSIS — I10 Essential (primary) hypertension: Secondary | ICD-10-CM | POA: Diagnosis not present

## 2024-03-22 DIAGNOSIS — Z23 Encounter for immunization: Secondary | ICD-10-CM | POA: Diagnosis not present

## 2024-03-22 DIAGNOSIS — E782 Mixed hyperlipidemia: Secondary | ICD-10-CM | POA: Diagnosis not present

## 2024-03-22 DIAGNOSIS — H269 Unspecified cataract: Secondary | ICD-10-CM | POA: Diagnosis not present

## 2024-04-13 DIAGNOSIS — H353221 Exudative age-related macular degeneration, left eye, with active choroidal neovascularization: Secondary | ICD-10-CM | POA: Diagnosis not present

## 2024-04-13 DIAGNOSIS — H348322 Tributary (branch) retinal vein occlusion, left eye, stable: Secondary | ICD-10-CM | POA: Diagnosis not present

## 2024-04-13 DIAGNOSIS — H34832 Tributary (branch) retinal vein occlusion, left eye, with macular edema: Secondary | ICD-10-CM | POA: Diagnosis not present

## 2024-04-13 DIAGNOSIS — H353132 Nonexudative age-related macular degeneration, bilateral, intermediate dry stage: Secondary | ICD-10-CM | POA: Diagnosis not present

## 2024-04-13 DIAGNOSIS — H35722 Serous detachment of retinal pigment epithelium, left eye: Secondary | ICD-10-CM | POA: Diagnosis not present

## 2024-04-13 DIAGNOSIS — H35372 Puckering of macula, left eye: Secondary | ICD-10-CM | POA: Diagnosis not present

## 2024-04-13 DIAGNOSIS — H4042X2 Glaucoma secondary to eye inflammation, left eye, moderate stage: Secondary | ICD-10-CM | POA: Diagnosis not present

## 2024-04-13 DIAGNOSIS — H35731 Hemorrhagic detachment of retinal pigment epithelium, right eye: Secondary | ICD-10-CM | POA: Diagnosis not present

## 2024-04-13 DIAGNOSIS — H353211 Exudative age-related macular degeneration, right eye, with active choroidal neovascularization: Secondary | ICD-10-CM | POA: Diagnosis not present

## 2024-04-13 DIAGNOSIS — H348312 Tributary (branch) retinal vein occlusion, right eye, stable: Secondary | ICD-10-CM | POA: Diagnosis not present

## 2024-04-13 DIAGNOSIS — H43813 Vitreous degeneration, bilateral: Secondary | ICD-10-CM | POA: Diagnosis not present

## 2024-04-15 DIAGNOSIS — L570 Actinic keratosis: Secondary | ICD-10-CM | POA: Diagnosis not present

## 2024-04-15 DIAGNOSIS — L821 Other seborrheic keratosis: Secondary | ICD-10-CM | POA: Diagnosis not present

## 2024-04-15 DIAGNOSIS — Z85828 Personal history of other malignant neoplasm of skin: Secondary | ICD-10-CM | POA: Diagnosis not present

## 2024-04-15 DIAGNOSIS — X32XXXD Exposure to sunlight, subsequent encounter: Secondary | ICD-10-CM | POA: Diagnosis not present

## 2024-04-15 DIAGNOSIS — Z08 Encounter for follow-up examination after completed treatment for malignant neoplasm: Secondary | ICD-10-CM | POA: Diagnosis not present

## 2024-05-24 DIAGNOSIS — H43813 Vitreous degeneration, bilateral: Secondary | ICD-10-CM | POA: Diagnosis not present

## 2024-05-24 DIAGNOSIS — H353221 Exudative age-related macular degeneration, left eye, with active choroidal neovascularization: Secondary | ICD-10-CM | POA: Diagnosis not present

## 2024-05-24 DIAGNOSIS — H35731 Hemorrhagic detachment of retinal pigment epithelium, right eye: Secondary | ICD-10-CM | POA: Diagnosis not present

## 2024-05-24 DIAGNOSIS — H35722 Serous detachment of retinal pigment epithelium, left eye: Secondary | ICD-10-CM | POA: Diagnosis not present

## 2024-05-24 DIAGNOSIS — H348322 Tributary (branch) retinal vein occlusion, left eye, stable: Secondary | ICD-10-CM | POA: Diagnosis not present

## 2024-05-24 DIAGNOSIS — H4312 Vitreous hemorrhage, left eye: Secondary | ICD-10-CM | POA: Diagnosis not present

## 2024-05-24 DIAGNOSIS — H348312 Tributary (branch) retinal vein occlusion, right eye, stable: Secondary | ICD-10-CM | POA: Diagnosis not present

## 2024-05-24 DIAGNOSIS — H35372 Puckering of macula, left eye: Secondary | ICD-10-CM | POA: Diagnosis not present

## 2024-05-24 DIAGNOSIS — H353211 Exudative age-related macular degeneration, right eye, with active choroidal neovascularization: Secondary | ICD-10-CM | POA: Diagnosis not present

## 2024-05-24 DIAGNOSIS — H4042X2 Glaucoma secondary to eye inflammation, left eye, moderate stage: Secondary | ICD-10-CM | POA: Diagnosis not present

## 2024-05-24 DIAGNOSIS — H34832 Tributary (branch) retinal vein occlusion, left eye, with macular edema: Secondary | ICD-10-CM | POA: Diagnosis not present

## 2024-05-24 DIAGNOSIS — H353132 Nonexudative age-related macular degeneration, bilateral, intermediate dry stage: Secondary | ICD-10-CM | POA: Diagnosis not present

## 2024-05-26 DIAGNOSIS — H35731 Hemorrhagic detachment of retinal pigment epithelium, right eye: Secondary | ICD-10-CM | POA: Diagnosis not present

## 2024-05-26 DIAGNOSIS — H348312 Tributary (branch) retinal vein occlusion, right eye, stable: Secondary | ICD-10-CM | POA: Diagnosis not present

## 2024-05-26 DIAGNOSIS — H34832 Tributary (branch) retinal vein occlusion, left eye, with macular edema: Secondary | ICD-10-CM | POA: Diagnosis not present

## 2024-05-26 DIAGNOSIS — H35372 Puckering of macula, left eye: Secondary | ICD-10-CM | POA: Diagnosis not present

## 2024-05-26 DIAGNOSIS — H353132 Nonexudative age-related macular degeneration, bilateral, intermediate dry stage: Secondary | ICD-10-CM | POA: Diagnosis not present

## 2024-05-26 DIAGNOSIS — H353211 Exudative age-related macular degeneration, right eye, with active choroidal neovascularization: Secondary | ICD-10-CM | POA: Diagnosis not present

## 2024-05-26 DIAGNOSIS — H4042X2 Glaucoma secondary to eye inflammation, left eye, moderate stage: Secondary | ICD-10-CM | POA: Diagnosis not present

## 2024-05-26 DIAGNOSIS — H4312 Vitreous hemorrhage, left eye: Secondary | ICD-10-CM | POA: Diagnosis not present

## 2024-05-26 DIAGNOSIS — H353221 Exudative age-related macular degeneration, left eye, with active choroidal neovascularization: Secondary | ICD-10-CM | POA: Diagnosis not present

## 2024-06-02 DIAGNOSIS — H348321 Tributary (branch) retinal vein occlusion, left eye, with retinal neovascularization: Secondary | ICD-10-CM | POA: Diagnosis not present

## 2024-06-02 DIAGNOSIS — H348121 Central retinal vein occlusion, left eye, with retinal neovascularization: Secondary | ICD-10-CM | POA: Diagnosis not present

## 2024-06-02 DIAGNOSIS — H3522 Other non-diabetic proliferative retinopathy, left eye: Secondary | ICD-10-CM | POA: Diagnosis not present

## 2024-06-02 DIAGNOSIS — H4312 Vitreous hemorrhage, left eye: Secondary | ICD-10-CM | POA: Diagnosis not present
